# Patient Record
Sex: Female | Born: 1965 | Race: Black or African American | Hispanic: No | Marital: Married | Smoking: Never smoker
Health system: Southern US, Community
[De-identification: ages and names within clinical notes are randomized; demographics above are authoritative.]

## PROBLEM LIST (undated history)

## (undated) DIAGNOSIS — Z87898 Personal history of other specified conditions: Secondary | ICD-10-CM

## (undated) DIAGNOSIS — I10 Essential (primary) hypertension: Secondary | ICD-10-CM

## (undated) DIAGNOSIS — S299XXA Unspecified injury of thorax, initial encounter: Secondary | ICD-10-CM

## (undated) DIAGNOSIS — F40240 Claustrophobia: Secondary | ICD-10-CM

## (undated) DIAGNOSIS — N920 Excessive and frequent menstruation with regular cycle: Secondary | ICD-10-CM

## (undated) DIAGNOSIS — Z889 Allergy status to unspecified drugs, medicaments and biological substances status: Secondary | ICD-10-CM

## (undated) DIAGNOSIS — Z8619 Personal history of other infectious and parasitic diseases: Secondary | ICD-10-CM

## (undated) HISTORY — DX: Essential (primary) hypertension: I10

## (undated) HISTORY — DX: Personal history of other specified conditions: Z87.898

## (undated) HISTORY — DX: Personal history of other infectious and parasitic diseases: Z86.19

## (undated) HISTORY — DX: Allergy status to unspecified drugs, medicaments and biological substances status: Z88.9

## (undated) HISTORY — PX: OTHER SURGICAL HISTORY: SHX169

---

## 2003-07-06 ENCOUNTER — Other Ambulatory Visit: Admission: RE | Admit: 2003-07-06 | Discharge: 2003-07-06 | Payer: Self-pay | Admitting: Obstetrics and Gynecology

## 2003-12-15 ENCOUNTER — Ambulatory Visit (HOSPITAL_COMMUNITY): Admission: RE | Admit: 2003-12-15 | Discharge: 2003-12-15 | Payer: Self-pay | Admitting: Obstetrics and Gynecology

## 2004-04-24 ENCOUNTER — Inpatient Hospital Stay (HOSPITAL_COMMUNITY): Admission: AD | Admit: 2004-04-24 | Discharge: 2004-04-26 | Payer: Self-pay | Admitting: Obstetrics and Gynecology

## 2004-07-20 ENCOUNTER — Other Ambulatory Visit: Admission: RE | Admit: 2004-07-20 | Discharge: 2004-07-20 | Payer: Self-pay | Admitting: Obstetrics and Gynecology

## 2005-08-01 ENCOUNTER — Other Ambulatory Visit: Admission: RE | Admit: 2005-08-01 | Discharge: 2005-08-01 | Payer: Self-pay | Admitting: Obstetrics and Gynecology

## 2005-09-01 ENCOUNTER — Encounter (INDEPENDENT_AMBULATORY_CARE_PROVIDER_SITE_OTHER): Payer: Self-pay | Admitting: *Deleted

## 2005-09-01 ENCOUNTER — Ambulatory Visit (HOSPITAL_COMMUNITY): Admission: AD | Admit: 2005-09-01 | Discharge: 2005-09-01 | Payer: Self-pay | Admitting: Obstetrics and Gynecology

## 2005-09-01 ENCOUNTER — Encounter: Payer: Self-pay | Admitting: Emergency Medicine

## 2009-07-30 ENCOUNTER — Emergency Department (HOSPITAL_COMMUNITY): Admission: EM | Admit: 2009-07-30 | Discharge: 2009-07-31 | Payer: Self-pay | Admitting: Emergency Medicine

## 2011-01-31 LAB — URINALYSIS, ROUTINE W REFLEX MICROSCOPIC
Bilirubin Urine: NEGATIVE
Glucose, UA: NEGATIVE mg/dL
Hgb urine dipstick: NEGATIVE
Ketones, ur: NEGATIVE mg/dL
Nitrite: POSITIVE — AB
Protein, ur: NEGATIVE mg/dL
Specific Gravity, Urine: 1.023 (ref 1.005–1.030)
Urobilinogen, UA: 1 mg/dL (ref 0.0–1.0)
pH: 5.5 (ref 5.0–8.0)

## 2011-01-31 LAB — CBC
MCHC: 32.2 g/dL (ref 30.0–36.0)
RDW: 16.2 % — ABNORMAL HIGH (ref 11.5–15.5)

## 2011-01-31 LAB — URINE MICROSCOPIC-ADD ON

## 2011-01-31 LAB — DIFFERENTIAL
Basophils Absolute: 0.1 10*3/uL (ref 0.0–0.1)
Basophils Relative: 0 % (ref 0–1)
Eosinophils Absolute: 0.4 10*3/uL (ref 0.0–0.7)
Neutro Abs: 8.1 10*3/uL — ABNORMAL HIGH (ref 1.7–7.7)
Neutrophils Relative %: 58 % (ref 43–77)

## 2011-01-31 LAB — POCT PREGNANCY, URINE: Preg Test, Ur: NEGATIVE

## 2011-01-31 LAB — POCT I-STAT, CHEM 8
Calcium, Ion: 1.2 mmol/L (ref 1.12–1.32)
Chloride: 102 mEq/L (ref 96–112)
HCT: 38 % (ref 36.0–46.0)
Hemoglobin: 12.9 g/dL (ref 12.0–15.0)
Potassium: 4.2 mEq/L (ref 3.5–5.1)

## 2011-03-15 NOTE — Op Note (Signed)
NAMEARTESHA, Courtney Drake NO.:  [id number]   MEDICAL RECORD NO.:  [id number]          PATIENT TYPE:  AMB   LOCATION:  SDC                           FACILITY:  [facility]   PHYSICIAN:  Osborn Coho, M.D.   DATE OF BIRTH:  1966/06/10   DATE OF PROCEDURE:  09/01/2005  DATE OF DISCHARGE:                                 OPERATIVE REPORT   PREOPERATIVE DIAGNOSIS:  Right ectopic pregnancy.   POSTOPERATIVE DIAGNOSIS:  Right ectopic pregnancy.   PROCEDURE:  Laparoscopic right salpingectomy.   SURGEON:  Osborn Coho, M.D.   ANESTHESIA:  General.   FLUIDS:  2500 mL.   URINE OUTPUT:  400 mL.   ESTIMATED BLOOD LOSS:  Approximately 20 mL of blood including clot in the  abdomen.   COMPLICATIONS:  None.   FINDINGS:  Right ectopic with hemoperitoneum.  Two small, approximately 1 cm  left paratubal cysts.   PROCEDURE:  The patient is taken to the operating room after risks,  benefits, alternatives reviewed with the patient. The patient verbalized  understanding and consent signed and witnessed.  After risks, benefits,  alternatives reviewed. The patient verbalized understanding and consent  signed and witnessed. The patient was placed under general anesthesia and  prepped and draped in the normal sterile fashion in the dorsolithotomy  position. A Foley was placed in the bladder and a sponge stick placed for  uterine manipulation. Attention was turned to the abdomen where a 10 mm  incision was made at the umbilicus. Laparoscope was introduced and findings  as noted above. After Veress needle and trocar advanced to the intra-  abdominal cavity. The left lower quadrant was identified. A 5 mm incision  was made and trocar introduced under direct visualization. A 5 mm suprapubic  incision in the midline was made as well and the trocar advanced under  direct visualization. Half percent Marcaine was used at each incision site  prior to incision and a total of 20 mL was used. While  in Trendelenburg  right ectopic was identified and ligated twice with 0 Vicryl Endoloops. The  portion of the tube was cauterized with the Kleppingers and excised, placed  in an Endopouch and removed via the left lower quadrant port after extending  it to 10 mm. The hemoperitoneum was suction irrigated copiously. It was  completely evacuated with reverse Trendelenburg. The patient was replaced in  a flat position and hemostasis was assured at the pedicle. The bilateral  ovaries appeared within normal limits. The 10 mm trocar was  removed and fascia repaired with 0 Vicryl in a running fashion.  Pneumoperitoneum was relieved. All skin incisions were repaired with 3-0  Monocryl using a subcuticular stitch. Sponge, lap and needle count was  correct. The patient tolerated procedure well and is currently awaiting  transfer to the recovery room in good condition.      Osborn Coho, M.D.  Electronically Signed     AR/MEDQ  D:  09/01/2005  T:  09/01/2005  Job:  [id number]

## 2011-03-15 NOTE — H&P (Signed)
Courtney Drake, Courtney Drake   MEDICAL RECORD NO.:  [id number]                   PATIENT TYPE:  MAT   LOCATION:  MATC                                 FACILITY:  [facility]   PHYSICIAN:  Osborn Coho, M.D.                DATE OF BIRTH:  Apr 14, 1966   DATE OF ADMISSION:  04/24/2004  DATE OF DISCHARGE:                                HISTORY & PHYSICAL   HISTORY OF PRESENT ILLNESS:  Courtney Drake is a 45 year old, gravida 2, para 1-0-0-  1, at 39-3/7 weeks, who presents with increasing uterine contractions today.  Denies leaking or bleeding and reports positive fetal movement.  Pregnancy  has been remarkable for:  (1) Advanced maternal age with amnio decline.  (2)  First trimester bleeding.  (3) Slightly late to care.  (4)  History of  questionable SGA infant.  (5) Elevated 1 hour Glucola with normal 3 hour  GTT.   PRENATAL LABORATORY DATA:  Blood type is B positive, Rh antibody negative,  VDRL nonreactive, rubella titer positive, hepatitis B surface antigen  negative, Pap was normal.  GC and Chlamydia cultures were negative.  Hemoglobin upon entry into practice was 11.4; it was 10 at 27 weeks.  Sickle  cell test was negative.  EDC of April 28, 2004, was established by last  menstrual period and was in agreement with ultrasound at approximately 7  weeks.  Group B strep culture was negative at 36 weeks.  GC and Chlamydia  cultures were also negative.   HISTORY OF PRESENT PREGNANCY:  The patient entered care at approximately 18  weeks.  She was considering amnio but then elected to defer.  She had a  normal quadruple screen.  She had an elevated 1 hour Glucola of 197 but had  a normal 3 hour GTT.  The rest of her pregnancy was essentially  uncomplicated.   MEDICAL HISTORY:  1. She had chicken pox as an adult.  2. She reports the usual childhood illnesses.  3. Her only hospitalization was for childbirth.  4. She has had no previous surgery.   OBSTETRICAL  HISTORY:  In 1989, she had a vaginal birth of a female infant,  weight 5 pounds 15 ounces at [redacted] weeks gestation.  She was in labor 7 hours.  She had epidural anesthesia.  She had no complications.   FAMILY HISTORY:  Mother and maternal uncles all had an MI.  Her mother had  chronic hypertension.  Nieces have asthma.  Mother and maternal uncle has  insulin-dependent diabetes.  Maternal uncle had dialysis for renal disease.  Maternal uncle had a stroke.  Father had some unknown type of cancer.  Brothers and sisters were alcohol and nicotine users.  Father was also an  alcohol user.   GENETIC HISTORY:  Remarkable for the patient's age of 65.  The patient's  sister had twins, and the  father of the baby is a twin.  There were 6 sets  of twins in the family.   SOCIAL HISTORY:  The patient is married to the father of the baby.  He is  involved and supportive.  His name is Courtney Drake.  The patient has 2 years of  college.  She is employed as a Information systems manager.  Her partner has 2 years  of college.  He is employed as a Estate agent.  She has been followed  by the physician service at [facility].  She denies any alcohol,  drug, or tobacco use during this pregnancy.   MEDICATION ALLERGIES:  None.  The patient does have sensitivity to POWDERED  GLOVES when worn by the patient.   PHYSICAL EXAMINATION:  VITAL SIGNS:  Blood pressure is initially 163/97.  Other vital signs are stable.  HEENT:  Within normal limits.  LUNGS:  Bilateral breath sounds are clear.  HEART:  Regular rate and rhythm without murmur.  BREASTS:  Soft and nontender.  ABDOMEN:  Fundal height is approximately 38 cm.  Estimated fetal weight is 7-  8 pounds.  Uterine contractions are every 3-5 minutes, moderate quality.  Fetal monitor shows reassuring tracing with occasional mild variables.  CERVICAL EXAM:  5 cm, 100% vertex at a -1 to a -2 station with bulging bag  of water.  EXTREMITIES:  Deep tendon reflexes are 2+  without clonus.  There is a trace  edema noted.   IMPRESSION:  1. Intrauterine pregnancy at 39-3/7 weeks.  2. Active labor.   PLAN:  1. Admit to birthing suite for consult with Dr. Su Hilt as attending     physician.  2. Routine physician orders.  3. Pain medication p.r.n. per patient request.     Renaldo Reel. Emilee Hero, C.N.M.                   Osborn Coho, M.D.    VLL/MEDQ  D:  04/24/2004  T:  04/24/2004  Job:  [id number]

## 2012-11-20 ENCOUNTER — Telehealth: Payer: Self-pay | Admitting: Obstetrics and Gynecology

## 2012-11-20 ENCOUNTER — Ambulatory Visit: Payer: BC Managed Care – PPO | Admitting: Obstetrics and Gynecology

## 2012-11-20 ENCOUNTER — Encounter: Payer: Self-pay | Admitting: Obstetrics and Gynecology

## 2012-11-20 VITALS — BP 142/90 | Ht 63.0 in | Wt 176.0 lb

## 2012-11-20 DIAGNOSIS — N920 Excessive and frequent menstruation with regular cycle: Secondary | ICD-10-CM | POA: Insufficient documentation

## 2012-11-20 DIAGNOSIS — Z124 Encounter for screening for malignant neoplasm of cervix: Secondary | ICD-10-CM

## 2012-11-20 DIAGNOSIS — Z139 Encounter for screening, unspecified: Secondary | ICD-10-CM

## 2012-11-20 DIAGNOSIS — Z8619 Personal history of other infectious and parasitic diseases: Secondary | ICD-10-CM | POA: Insufficient documentation

## 2012-11-20 DIAGNOSIS — L680 Hirsutism: Secondary | ICD-10-CM

## 2012-11-20 LAB — CBC
HCT: 26.8 % — ABNORMAL LOW (ref 36.0–46.0)
MCH: 19.5 pg — ABNORMAL LOW (ref 26.0–34.0)
MCHC: 29.9 g/dL — ABNORMAL LOW (ref 30.0–36.0)
RDW: 20 % — ABNORMAL HIGH (ref 11.5–15.5)

## 2012-11-20 LAB — TSH: TSH: 0.843 u[IU]/mL (ref 0.350–4.500)

## 2012-11-20 MED ORDER — EFLORNITHINE HCL 13.9 % EX CREA: 1.0000 "application " | TOPICAL_CREAM | Freq: Two times a day (BID) | CUTANEOUS | Status: DC

## 2012-11-20 NOTE — Telephone Encounter (Deleted)
Message sent To AR and JO and PG  Regarding placing order.  [facility] CMA

## 2012-11-20 NOTE — Progress Notes (Signed)
Contraception None Last pap per pt 2007 Normal  Last Mammo: never had one  Last Colonoscopy n/a Last Dexa Scan n/a Primary MD: ? Abuse at Home: No   C/o heavy prolonged cycles.  Filed Vitals:   11/20/12 0959  BP: 142/90     ROS: noncontributory  Physical Examination: General appearance - alert, well appearing, and in no distress Neck - supple, no significant adenopathy Chest - clear to auscultation, no wheezes, rales or rhonchi, symmetric air entry Heart - normal rate and regular rhythm Abdomen - soft, nontender, nondistended, no masses or organomegaly Breasts - breasts appear normal, no suspicious masses, no skin or nipple changes or axillary nodes Pelvic - normal external genitalia, vulva, vagina, cervix, uterus and adnexa Back exam - no CVAT Extremities - no edema, redness or tenderness in the calves or thighs  A/P Menorrhagia - labs, u/s, embx at [facility] rto after u/s for f/u and embx sched mammo Hirsutism - labs and trial of vaniqa

## 2012-11-20 NOTE — Telephone Encounter (Signed)
Order in system for screening mammogram  Darien Ramus, CMA

## 2012-11-23 LAB — PAP IG W/ RFLX HPV ASCU

## 2012-11-23 LAB — TESTOSTERONE, TOTAL AND FREE DIRECT MEASURE: Testosterone: 36.14 ng/dL (ref 10–70)

## 2012-11-24 ENCOUNTER — Encounter: Payer: Self-pay | Admitting: Obstetrics and Gynecology

## 2012-11-24 ENCOUNTER — Ambulatory Visit: Payer: Self-pay | Admitting: Obstetrics and Gynecology

## 2012-12-01 ENCOUNTER — Telehealth: Payer: Self-pay

## 2012-12-01 NOTE — Telephone Encounter (Signed)
LM for pt to cb  Re: blood test results.Melody Comas A

## 2012-12-02 ENCOUNTER — Telehealth: Payer: Self-pay

## 2012-12-02 NOTE — Telephone Encounter (Signed)
LM for pt to cb re: labs. Melody Comas A

## 2012-12-04 ENCOUNTER — Ambulatory Visit
Admission: RE | Admit: 2012-12-04 | Discharge: 2012-12-04 | Disposition: A | Discharge status: Home / Self Care | Payer: BC Managed Care – PPO | Source: Ambulatory Visit | Attending: Obstetrics and Gynecology | Admitting: Obstetrics and Gynecology

## 2012-12-04 ENCOUNTER — Ambulatory Visit: Payer: Self-pay

## 2012-12-04 DIAGNOSIS — Z139 Encounter for screening, unspecified: Secondary | ICD-10-CM

## 2012-12-07 ENCOUNTER — Telehealth: Payer: Self-pay

## 2012-12-07 DIAGNOSIS — N911 Secondary amenorrhea: Secondary | ICD-10-CM | POA: Insufficient documentation

## 2012-12-07 NOTE — Telephone Encounter (Signed)
LM for pt. To start taking iron supplement bid. I also rec stool softener for inevitable resulting constipation and for her to increase water intake a lot. Courtney Drake A

## 2012-12-09 ENCOUNTER — Other Ambulatory Visit: Payer: Self-pay | Admitting: Obstetrics and Gynecology

## 2012-12-09 ENCOUNTER — Ambulatory Visit: Payer: BC Managed Care – PPO | Admitting: Obstetrics and Gynecology

## 2012-12-09 ENCOUNTER — Telehealth: Payer: Self-pay | Admitting: Obstetrics and Gynecology

## 2012-12-09 ENCOUNTER — Encounter: Payer: Self-pay | Admitting: Obstetrics and Gynecology

## 2012-12-09 ENCOUNTER — Ambulatory Visit: Payer: BC Managed Care – PPO

## 2012-12-09 VITALS — BP 138/82 | Resp 16 | Ht 63.0 in | Wt 176.0 lb

## 2012-12-09 DIAGNOSIS — N920 Excessive and frequent menstruation with regular cycle: Secondary | ICD-10-CM

## 2012-12-09 NOTE — Progress Notes (Addendum)
Here to f/u u/s, labs and get em bx  Filed Vitals:   12/09/12 1105  BP: 138/82  Resp: 16   ROS: noncontributory  Pelvic exam:  VULVA: normal appearing vulva with no masses, tenderness or lesions,  VAGINA: normal appearing vagina with normal color and discharge, no lesions, CERVIX: normal appearing cervix without discharge or lesions,  UTERUS: uterus is normal size, shape, consistency and nontender,  ADNEXA: normal adnexa in size, nontender and no masses.  U/s - ut 6.5x5.4x4.9cm, nl bil ovaries, lt 3.1cm, rt 3.1cm  EmBx Performed per protocol Pipella passed x 3 to 10cm  Labs wnl except hgb 8 Pap neg  A/P Options and recs reviewed - rec tx secondary to anemia rec iron tid with colace Pt considering hysterectomy but all options discussed and rec minor procedures as first line (mirena vs ablation) Pamphlets - ablation, mirena, hysterectomy RTO in 1-2 wks to f/u bx results and discuss decision If pt wants ablation she will need sonohyst in prep for in office hyst/D&C/Ablation

## 2012-12-09 NOTE — Telephone Encounter (Signed)
Advised pt per U/S tech to come on full bladder  Darien Ramus, CMA

## 2012-12-14 LAB — PATHOLOGY

## 2013-03-09 ENCOUNTER — Emergency Department (HOSPITAL_COMMUNITY): Payer: BC Managed Care – PPO

## 2013-03-09 ENCOUNTER — Emergency Department (HOSPITAL_COMMUNITY)
Admission: EM | Admit: 2013-03-09 | Discharge: 2013-03-09 | Disposition: A | Discharge status: Home / Self Care | Payer: BC Managed Care – PPO | Attending: Emergency Medicine | Admitting: Emergency Medicine

## 2013-03-09 ENCOUNTER — Encounter (HOSPITAL_COMMUNITY): Payer: Self-pay | Admitting: Emergency Medicine

## 2013-03-09 ENCOUNTER — Other Ambulatory Visit: Payer: Self-pay

## 2013-03-09 DIAGNOSIS — M79609 Pain in unspecified limb: Secondary | ICD-10-CM | POA: Insufficient documentation

## 2013-03-09 DIAGNOSIS — I10 Essential (primary) hypertension: Secondary | ICD-10-CM | POA: Insufficient documentation

## 2013-03-09 DIAGNOSIS — R079 Chest pain, unspecified: Secondary | ICD-10-CM | POA: Insufficient documentation

## 2013-03-09 DIAGNOSIS — Z8742 Personal history of other diseases of the female genital tract: Secondary | ICD-10-CM | POA: Insufficient documentation

## 2013-03-09 DIAGNOSIS — M722 Plantar fascial fibromatosis: Secondary | ICD-10-CM

## 2013-03-09 DIAGNOSIS — Z8619 Personal history of other infectious and parasitic diseases: Secondary | ICD-10-CM | POA: Insufficient documentation

## 2013-03-09 LAB — BASIC METABOLIC PANEL
BUN: 7 mg/dL (ref 6–23)
CO2: 27 mEq/L (ref 19–32)
Chloride: 101 mEq/L (ref 96–112)
Creatinine, Ser: 0.63 mg/dL (ref 0.50–1.10)
GFR calc Af Amer: >90 mL/min (ref >90)
Glucose, Bld: 91 mg/dL (ref 70–99)
Potassium: 3.8 mEq/L (ref 3.5–5.1)

## 2013-03-09 LAB — POCT I-STAT TROPONIN I: Troponin i, poc: 0 ng/mL (ref 0.00–0.08)

## 2013-03-09 LAB — CBC
HCT: 35.8 % — ABNORMAL LOW (ref 36.0–46.0)
Hemoglobin: 11.7 g/dL — ABNORMAL LOW (ref 12.0–15.0)
MCV: 83.1 fL (ref 78.0–100.0)
RDW: 18.7 % — ABNORMAL HIGH (ref 11.5–15.5)
WBC: 9.8 10*3/uL (ref 4.0–10.5)

## 2013-03-09 MED ORDER — LORAZEPAM 1 MG PO TABS: 1.0000 mg | ORAL_TABLET | Freq: Once | ORAL | Status: DC

## 2013-03-09 MED ORDER — IOHEXOL 300 MG/ML  SOLN
80.0000 mL | Freq: Once | INTRAMUSCULAR | Status: AC | PRN
  Administered 2013-03-09: 80 mL via INTRAVENOUS

## 2013-03-09 MED ORDER — OMEPRAZOLE 20 MG PO CPDR: 20.0000 mg | DELAYED_RELEASE_CAPSULE | Freq: Every day | ORAL | Status: DC

## 2013-03-09 MED ORDER — LORAZEPAM 2 MG/ML IJ SOLN
1.0000 mg | Freq: Once | INTRAMUSCULAR | Status: DC
  Filled 2013-03-09: qty 1

## 2013-03-09 MED ORDER — HYDROCHLOROTHIAZIDE 12.5 MG PO TABS: 12.5000 mg | ORAL_TABLET | Freq: Every day | ORAL | Status: DC

## 2013-03-09 NOTE — ED Provider Notes (Signed)
History     CSN: [id number]  Arrival date & time 03/09/13  4098   First MD Initiated Contact with Patient 03/09/13 [phone]      Chief Complaint  Patient presents with  . Chest Pain  . Leg Pain    (Consider location/radiation/quality/duration/timing/severity/associated sxs/prior treatment) HPI Comments: Patient presents emergency department with chief complaint of chest pain times one month. She states that she notices the pain when she lays down at night. She states the pain is located on the left side of her chest and sometimes radiates to her left arm. She denies any diaphoresis or clamminess, but does endorse some intermittent shortness of breath. She denies fevers, chills, nausea, or vomiting. She states that the pain is not exacerbated with activity. She has not tried taking anything to alleviate her symptoms.  Additionally, she complains of left foot pain. She states this is been going on for several months. She states the pain is located on the bottom of her foot near the arch. She believes that it is worsened with the shoes that she wears. She has not tried anything to alleviate her symptoms. She states the pain radiates to her ankle.  The history is provided by the patient. No language interpreter was used.    Past Medical History  Diagnosis Date  . Ectopic pregnancy affecting fetus or newborn   . History of chicken pox   . Hypertension   . Secondary amenorrhea   . Galactorrhea     Past Surgical History  Procedure Laterality Date  . Fallopian tube removal      Family History  Problem Relation Age of Onset  . Breast cancer Father   . Heart attack Mother   . Diabetes Mother   . Hypertension Mother   . Stroke Mother   . Kidney failure Mother   . Asthma Daughter   . Kidney failure Maternal Uncle     History  Substance Use Topics  . Smoking status: Never Smoker   . Smokeless tobacco: Not on file  . Alcohol Use: No    OB History   Grav Para Term Preterm  Abortions TAB SAB Ect Mult Living   3 2   1  1          Review of Systems  All other systems reviewed and are negative.    Allergies  Review of patient's allergies indicates no known allergies.  Home Medications  No current outpatient prescriptions on file.  BP 197/113  Pulse 87  Temp(Src) 98 F (36.7 C) (Oral)  Resp 18  SpO2 99%  LMP 02/19/2013  Physical Exam  Nursing note and vitals reviewed. Constitutional: She is oriented to person, place, and time. She appears well-developed and well-nourished.  HENT:  Head: Normocephalic and atraumatic.  Eyes: Conjunctivae and EOM are normal. Pupils are equal, round, and reactive to light.  Neck: Normal range of motion. Neck supple.  Cardiovascular: Normal rate and regular rhythm.  Exam reveals no gallop and no friction rub.   No murmur heard. Pulmonary/Chest: Effort normal and breath sounds normal. No respiratory distress. She has no wheezes. She has no rales. She exhibits no tenderness.  Abdominal: Soft. Bowel sounds are normal. She exhibits no distension and no mass. There is no tenderness. There is no rebound and no guarding.  Musculoskeletal: Normal range of motion. She exhibits no edema and no tenderness.  Left foot mildly tender to palpation over the arch, range of motion is 5/5, strength 5/5, no palpable abnormality or deformity  Neurological: She is alert and oriented to person, place, and time.  Skin: Skin is warm and dry.  Psychiatric: She has a normal mood and affect. Her behavior is normal. Judgment and thought content normal.    ED Course  Procedures (including critical care time)  Labs Reviewed  CBC  BASIC METABOLIC PANEL  POCT I-STAT TROPONIN I   Dg Chest 2 View  03/09/2013  *RADIOLOGY REPORT*  Clinical Data: Chest pain  CHEST - 2 VIEW  Comparison: None.  Findings: Borderline cardiomegaly.  No pulmonary edema.  There is nodular density in the upper lobe posteriorly seen on lateral view. Further evaluation with  CT scan of the chest is recommended to exclude a lung nodule.  No segmental infiltrate.  Bony thorax is unremarkable.  IMPRESSION: There is nodular density in the upper lobe posteriorly seen on lateral view.  Further evaluation with CT scan of the chest is recommended to exclude a lung nodule.  No segmental infiltrate.   Original Report Authenticated By: Natasha Mead, M.D.    Results for orders placed during the hospital encounter of 03/09/13  CBC      Result Value Range   WBC 9.8  4.0 - 10.5 K/uL   RBC 4.31  3.87 - 5.11 MIL/uL   Hemoglobin 11.7 (*) 12.0 - 15.0 g/dL   HCT 16.1 (*) 09.6 - 04.5 %   MCV 83.1  78.0 - 100.0 fL   MCH 27.1  26.0 - 34.0 pg   MCHC 32.7  30.0 - 36.0 g/dL   RDW 40.9 (*) 81.1 - 91.4 %   Platelets 273  150 - 400 K/uL  BASIC METABOLIC PANEL      Result Value Range   Sodium 135  135 - 145 mEq/L   Potassium 3.8  3.5 - 5.1 mEq/L   Chloride 101  96 - 112 mEq/L   CO2 27  19 - 32 mEq/L   Glucose, Bld 91  70 - 99 mg/dL   BUN 7  6 - 23 mg/dL   Creatinine, Ser 7.82  0.50 - 1.10 mg/dL   Calcium 8.9  8.4 - 95.6 mg/dL   GFR calc non Af Amer >90  >90 mL/min   GFR calc Af Amer >90  >90 mL/min  POCT I-STAT TROPONIN I      Result Value Range   Troponin i, poc 0.00  0.00 - 0.08 ng/mL   Comment 3           POCT I-STAT TROPONIN I      Result Value Range   Troponin i, poc 0.00  0.00 - 0.08 ng/mL   Comment 3            Dg Chest 2 View  03/09/2013  *RADIOLOGY REPORT*  Clinical Data: Chest pain  CHEST - 2 VIEW  Comparison: None.  Findings: Borderline cardiomegaly.  No pulmonary edema.  There is nodular density in the upper lobe posteriorly seen on lateral view. Further evaluation with CT scan of the chest is recommended to exclude a lung nodule.  No segmental infiltrate.  Bony thorax is unremarkable.  IMPRESSION: There is nodular density in the upper lobe posteriorly seen on lateral view.  Further evaluation with CT scan of the chest is recommended to exclude a lung nodule.  No  segmental infiltrate.   Original Report Authenticated By: Natasha Mead, M.D.    Ct Chest W Contrast  03/09/2013  *RADIOLOGY REPORT*  Clinical Data: Evaluate nodule.  CT CHEST WITH CONTRAST  Technique:  Multidetector CT imaging of the chest was performed following the standard protocol during bolus administration of intravenous contrast.  Contrast: 80mL OMNIPAQUE IOHEXOL 300 MG/ML  SOLN  Comparison: Chest radiograph 05/13 /2014  Findings: The heart is mildly enlarged.  The pericardium is normal. The thoracic aorta is normal in caliber and enhancement.  There is a small hiatal hernia.  Imaged portion of the thyroid gland is normal.  No pathologic lymphadenopathy in the chest.  Negative for pleural effusion.  There is a very small amount of soft tissue density admixed with fat in the anterior mediastinum consistent with a small amount of residual thymic tissue.  Both lungs are well expanded and clear.  Negative for pulmonary mass, nodule, or airspace disease.  The trachea and mainstem bronchi are patent.  The thoracic spine vertebral bodies are normal in height and alignment.  There are no significant degenerative changes.  No acute or suspicious bony abnormality in the thorax.  Approximately five tiny circumscribed low density lesions in the liver, the largest measuring 9 mm.  These are most consistent with benign hepatic cysts.  In the proximal pancreatic body is a 16 x 11 mm cyst.  In the pancreatic tail is a 7 x 6 mm low density lesion.  The spleen, adrenal glands, and visualized upper poles of the kidneys are normal.  IMPRESSION: 1.  Negative for pulmonary nodule or mass.  The opacity seen on the lateral view of today's chest radiograph likely reflected overlap of pulmonary vessels and scapula. 2.  No acute findings in the chest. 3. 16 x 11 mm cyst in the pancreatic body and 7 x 6 mm low density lesion in the pancreatic tail. These are likely benign.  A follow- up MRI of the abdomen with and without contrast  should be considered and 1 year for follow-up.  Alternatively, a CT of the abdomen with contrast could be performed in 1 year. 4.  Multiple small hepatic cysts. 5.  Mild cardiomegaly.  6.  Small hiatal hernia.   Original Report Authenticated By: Britta Mccreedy, M.D.       1. Chest pain, unspecified   2. Plantar fasciitis       MDM  Patient with chest pain times one month. We'll check basic labs and chest x-ray. Will reevaluate. She is also having left foot pain, which I believe is plantar fasciitis based on her presentation and exam.  I will order a CT chest to evaluate for the lung nodule today.    1:44 PM Still do not have creatinine result from BMP 2/2 downtime.  CT chest after creatinine.  Patient discussed with Dr. Effie Shy, who agrees with the plan.  No acute findings on chest CT.  Pain has been ongoing for 1 month.  Doubt ACS, TIMI score is 0.  PERC is negative.  Will discharge to home.  Will start the patient on HCTZ, as she is concerned about her BP, but I told her that she will need to seek follow-up care as this not something that we routinely manage in the ED.  I also have some suspicion that this could be GERD related, so I am going to try her on some omeprazole.  Patient given the resource guide.  She will follow up with primary care.  Incidental findings include: 16 x 11 mm cyst in the pancreatic body and 7 x 6 mm low density lesion in the pancreatic tail. These are likely benign.  A follow- up MRI of the abdomen with and  without contrast should be considered and 1 year for follow-up.  Alternatively, a CT of the abdomen with contrast could be performed in 1 year.  I had a conversation regarding the above mentioned incidental findings with the patient, and she verbally acknowledged that she would follow-up with her doctor in a year for MRI or repeat CT.  Roxy Horseman, PA-C 03/09/13 1830  Roxy Horseman, PA-C 03/12/13 [phone]

## 2013-03-09 NOTE — ED Notes (Signed)
Lab called to check on lab results.  Results continue to be pending.

## 2013-03-09 NOTE — ED Notes (Signed)
Pt c/o generalized CP x 1 month; pt sts left heel pain x several months

## 2013-03-12 NOTE — ED Provider Notes (Signed)
Medical screening examination/treatment/procedure(s) were performed by non-physician practitioner and as supervising physician I was immediately available for consultation/collaboration.  Flint Melter, MD 03/12/13 2120

## 2014-01-12 ENCOUNTER — Emergency Department (HOSPITAL_COMMUNITY): Payer: BC Managed Care – PPO

## 2014-01-12 ENCOUNTER — Inpatient Hospital Stay (HOSPITAL_COMMUNITY)
Admission: EM | Admit: 2014-01-12 | Discharge: 2014-01-13 | DRG: 305 | Disposition: A | Discharge status: Home / Self Care | Payer: BC Managed Care – PPO | Attending: Internal Medicine | Admitting: Internal Medicine

## 2014-01-12 ENCOUNTER — Encounter (HOSPITAL_COMMUNITY): Payer: Self-pay | Admitting: Emergency Medicine

## 2014-01-12 DIAGNOSIS — Z803 Family history of malignant neoplasm of breast: Secondary | ICD-10-CM

## 2014-01-12 DIAGNOSIS — R079 Chest pain, unspecified: Secondary | ICD-10-CM | POA: Diagnosis present

## 2014-01-12 DIAGNOSIS — I10 Essential (primary) hypertension: Principal | ICD-10-CM | POA: Diagnosis present

## 2014-01-12 DIAGNOSIS — E876 Hypokalemia: Secondary | ICD-10-CM | POA: Diagnosis present

## 2014-01-12 DIAGNOSIS — Z6831 Body mass index (BMI) 31.0-31.9, adult: Secondary | ICD-10-CM

## 2014-01-12 DIAGNOSIS — Z825 Family history of asthma and other chronic lower respiratory diseases: Secondary | ICD-10-CM

## 2014-01-12 DIAGNOSIS — Z8249 Family history of ischemic heart disease and other diseases of the circulatory system: Secondary | ICD-10-CM

## 2014-01-12 DIAGNOSIS — Z833 Family history of diabetes mellitus: Secondary | ICD-10-CM

## 2014-01-12 DIAGNOSIS — R072 Precordial pain: Secondary | ICD-10-CM

## 2014-01-12 DIAGNOSIS — I200 Unstable angina: Secondary | ICD-10-CM | POA: Diagnosis present

## 2014-01-12 DIAGNOSIS — D649 Anemia, unspecified: Secondary | ICD-10-CM | POA: Diagnosis present

## 2014-01-12 DIAGNOSIS — E669 Obesity, unspecified: Secondary | ICD-10-CM | POA: Diagnosis present

## 2014-01-12 DIAGNOSIS — Z823 Family history of stroke: Secondary | ICD-10-CM

## 2014-01-12 HISTORY — DX: Claustrophobia: F40.240

## 2014-01-12 HISTORY — DX: Excessive and frequent menstruation with regular cycle: N92.0

## 2014-01-12 LAB — BASIC METABOLIC PANEL
BUN: 8 mg/dL (ref 6–23)
CHLORIDE: 100 meq/L (ref 96–112)
CO2: 24 meq/L (ref 19–32)
Calcium: 9 mg/dL (ref 8.4–10.5)
Creatinine, Ser: 0.6 mg/dL (ref 0.50–1.10)
GFR calc Af Amer: >90 mL/min (ref >90)
GFR calc non Af Amer: >90 mL/min (ref >90)
GLUCOSE: 112 mg/dL — AB (ref 70–99)
POTASSIUM: 3.5 meq/L — AB (ref 3.7–5.3)
Sodium: 137 mEq/L (ref 137–147)

## 2014-01-12 LAB — I-STAT TROPONIN, ED: Troponin i, poc: 0.01 ng/mL (ref 0.00–0.08)

## 2014-01-12 LAB — CBC
HCT: 34 % — ABNORMAL LOW (ref 36.0–46.0)
HEMOGLOBIN: 10.8 g/dL — AB (ref 12.0–15.0)
MCH: 25.5 pg — AB (ref 26.0–34.0)
MCHC: 31.8 g/dL (ref 30.0–36.0)
MCV: 80.2 fL (ref 78.0–100.0)
PLATELETS: 324 10*3/uL (ref 150–400)
RBC: 4.24 MIL/uL (ref 3.87–5.11)
RDW: 15.9 % — ABNORMAL HIGH (ref 11.5–15.5)
WBC: 10.6 10*3/uL — AB (ref 4.0–10.5)

## 2014-01-12 LAB — MAGNESIUM: Magnesium: 1.9 mg/dL (ref 1.5–2.5)

## 2014-01-12 LAB — PROTIME-INR
INR: 1.03 (ref 0.00–1.49)
PROTHROMBIN TIME: 13.3 s (ref 11.6–15.2)

## 2014-01-12 LAB — HEMOGLOBIN A1C
Hgb A1c MFr Bld: 6 % — ABNORMAL HIGH (ref <5.7)
Mean Plasma Glucose: 126 mg/dL — ABNORMAL HIGH (ref <117)

## 2014-01-12 LAB — TROPONIN I: Troponin I: <0.3 ng/mL (ref <0.30)

## 2014-01-12 LAB — APTT: aPTT: 96 seconds — ABNORMAL HIGH (ref 24–37)

## 2014-01-12 LAB — PRO B NATRIURETIC PEPTIDE: PRO B NATRI PEPTIDE: 116.8 pg/mL (ref 0–125)

## 2014-01-12 MED ORDER — METOPROLOL TARTRATE 1 MG/ML IV SOLN: 5.0000 mg | INTRAVENOUS | Status: DC | PRN

## 2014-01-12 MED ORDER — METOPROLOL TARTRATE 1 MG/ML IV SOLN
5.0000 mg | Freq: Four times a day (QID) | INTRAVENOUS | Status: DC
  Administered 2014-01-12: 5 mg via INTRAVENOUS
  Filled 2014-01-12: qty 5

## 2014-01-12 MED ORDER — NITROGLYCERIN 0.4 MG SL SUBL: 0.4000 mg | SUBLINGUAL_TABLET | SUBLINGUAL | Status: DC | PRN

## 2014-01-12 MED ORDER — ATORVASTATIN CALCIUM 80 MG PO TABS
80.0000 mg | ORAL_TABLET | Freq: Every day | ORAL | Status: DC
  Administered 2014-01-12: 80 mg via ORAL
  Filled 2014-01-12 (×2): qty 1

## 2014-01-12 MED ORDER — NITROGLYCERIN IN D5W 200-5 MCG/ML-% IV SOLN: 2.0000 ug/min | INTRAVENOUS | Status: DC

## 2014-01-12 MED ORDER — CARVEDILOL 3.125 MG PO TABS
3.1250 mg | ORAL_TABLET | Freq: Two times a day (BID) | ORAL | Status: DC
  Administered 2014-01-12 – 2014-01-13 (×2): 3.125 mg via ORAL
  Filled 2014-01-12 (×4): qty 1

## 2014-01-12 MED ORDER — LISINOPRIL 5 MG PO TABS
5.0000 mg | ORAL_TABLET | Freq: Every day | ORAL | Status: DC
  Administered 2014-01-12 – 2014-01-13 (×2): 5 mg via ORAL
  Filled 2014-01-12 (×2): qty 1

## 2014-01-12 MED ORDER — HEPARIN BOLUS VIA INFUSION
4000.0000 [IU] | Freq: Once | INTRAVENOUS | Status: AC
  Administered 2014-01-12: 4000 [IU] via INTRAVENOUS
  Filled 2014-01-12: qty 4000

## 2014-01-12 MED ORDER — HYDRALAZINE HCL 20 MG/ML IJ SOLN
10.0000 mg | INTRAMUSCULAR | Status: DC | PRN
  Administered 2014-01-12: 10 mg via INTRAVENOUS
  Filled 2014-01-12: qty 1

## 2014-01-12 MED ORDER — HEPARIN (PORCINE) IN NACL 100-0.45 UNIT/ML-% IJ SOLN
900.0000 [IU]/h | INTRAMUSCULAR | Status: DC
  Administered 2014-01-12: 900 [IU]/h via INTRAVENOUS
  Filled 2014-01-12: qty 250

## 2014-01-12 MED ORDER — ASPIRIN 81 MG PO CHEW
324.0000 mg | CHEWABLE_TABLET | Freq: Once | ORAL | Status: AC
  Administered 2014-01-12: 324 mg via ORAL
  Filled 2014-01-12: qty 4

## 2014-01-12 MED ORDER — HYDRALAZINE HCL 20 MG/ML IJ SOLN: 5.0000 mg | INTRAMUSCULAR | Status: DC | PRN

## 2014-01-12 MED ORDER — ASPIRIN EC 81 MG PO TBEC
81.0000 mg | DELAYED_RELEASE_TABLET | Freq: Every day | ORAL | Status: DC
  Administered 2014-01-13: 81 mg via ORAL
  Filled 2014-01-12: qty 1

## 2014-01-12 MED ORDER — ASPIRIN EC 325 MG PO TBEC: 325.0000 mg | DELAYED_RELEASE_TABLET | Freq: Once | ORAL | Status: DC

## 2014-01-12 MED ORDER — POTASSIUM CHLORIDE CRYS ER 20 MEQ PO TBCR
40.0000 meq | EXTENDED_RELEASE_TABLET | Freq: Once | ORAL | Status: AC
  Administered 2014-01-12: 40 meq via ORAL
  Filled 2014-01-12: qty 2

## 2014-01-12 MED ORDER — ONDANSETRON HCL 4 MG/2ML IJ SOLN
4.0000 mg | Freq: Three times a day (TID) | INTRAMUSCULAR | Status: DC | PRN
  Administered 2014-01-12: 4 mg via INTRAVENOUS
  Filled 2014-01-12: qty 2

## 2014-01-12 NOTE — Care Management Note (Signed)
    Page 1 of 1   01/12/2014     2:47:31 PM   CARE MANAGEMENT NOTE 01/12/2014  Patient:  Dorothy PufferLI,Lakeishia M   Account Number:  [id number]  Date Initiated:  01/12/2014  Documentation initiated by:  GRAVES-BIGELOW,Kimberlyann Hollar  Subjective/Objective Assessment:   Pt admitted for CP / Htn urgency.     Action/Plan:   CM received consult for PCP. CM did provide pt with [facility] # for pt to call. No further needs from CM at this time.   Anticipated DC Date:  01/14/2014   Anticipated DC Plan:  HOME/SELF CARE      DC Planning Services  CM consult      Choice offered to / List presented to:             Status of service:  Completed, signed off Medicare Important Message given?   (If response is "NO", the following Medicare IM given date fields will be blank) Date Medicare IM given:   Date Additional Medicare IM given:    Discharge Disposition:  HOME/SELF CARE  Per UR Regulation:  Reviewed for med. necessity/level of care/duration of stay  If discussed at Long Length of Stay Meetings, dates discussed:    Comments:

## 2014-01-12 NOTE — Consult Note (Addendum)
Patient is young and sedentary. Her neck,left arm, and chest discomfort is nearly continuous and has features more compatible with musculoskeletal or neurogenic etiology than cardiac. Her dyspnea on exertion is concerning and probably multifactorial, but certainly there could be a component of DHF aggravated by poor BP control. Plan nuclear myocardial perfusion study to r/o CAD. Echo to assess LV and wall thickness. Need to treat Htn and other risk factors. Screen for DM.

## 2014-01-12 NOTE — Progress Notes (Signed)
UR Completed Lenward Able Graves-Bigelow, RN,BSN [phone]  

## 2014-01-12 NOTE — Progress Notes (Signed)
Pts BP recheck d/t being 201/110 earlier. Pt states she vomited in bathroom after trying to eat some dinner. Denies current nausea and states feels better. BP currently down to 122/68 manually. No neuro changes. Dr on call made aware. Will cont to monitor.

## 2014-01-12 NOTE — ED Provider Notes (Signed)
CSN: [id number]     Arrival date & time 01/12/14  [id number] History   First MD Initiated Contact with Patient 01/12/14 0830     Chief Complaint  Patient presents with  . Chest Pain     (Consider location/radiation/quality/duration/timing/severity/associated sxs/prior Treatment) Patient is a 48 y.o. female presenting with chest pain. The history is provided by the patient. No language interpreter was used.  Chest Pain Associated symptoms: shortness of breath   Associated symptoms: no abdominal pain, no cough, no fever, no headache, no nausea, no palpitations and not vomiting   This is a 47yo AAF w/ PMH previously diagnosed HTN (currently not on treatment) who presents w/ c/o central chest "heaviness" radiating into L arm that began at 0430 this AM. Pain woke pt up from sleep and was constant for three hours. Denies SOB, nausea, diaphoresis, palpitations. CP has eased up since onset, but she continues to have L shoulder pain. Patient has hx of intermittent similar chest pain that occurs w/ exertion and is associated with SOB. Both the CP and SOB usually resolve with rest. No recent injury or new activity. Denies cough, ST, BLE edema, abd pain. She does not have a PCP. She notes she was previously treated for HTN via ED w/ HCTZ, but is no longer taking this medication. BP during my interview was 200/101.   Past Medical History  Diagnosis Date  . Ectopic pregnancy affecting fetus or newborn   . History of chicken pox   . Hypertension   . Secondary amenorrhea   . Galactorrhea    Past Surgical History  Procedure Laterality Date  . Fallopian tube removal     Family History  Problem Relation Age of Onset  . Breast cancer Father   . Heart attack Mother   . Diabetes Mother   . Hypertension Mother   . Stroke Mother   . Kidney failure Mother   . Asthma Daughter   . Kidney failure Maternal Uncle    History  Substance Use Topics  . Smoking status: Never Smoker   . Smokeless tobacco: Not on  file  . Alcohol Use: No   OB History   Grav Para Term Preterm Abortions TAB SAB Ect Mult Living   3 2   1  1         Review of Systems  Constitutional: Negative for fever and chills.  HENT: Negative for congestion and sore throat.   Eyes: Negative for visual disturbance.  Respiratory: Positive for shortness of breath. Negative for cough.   Cardiovascular: Positive for chest pain. Negative for palpitations and leg swelling.  Gastrointestinal: Negative for nausea, vomiting, abdominal pain and diarrhea.  Genitourinary: Negative for dysuria.  Neurological: Negative for headaches.  All other systems reviewed and are negative.      Allergies  Review of patient's allergies indicates no known allergies.  Home Medications  No current outpatient prescriptions on file. BP 177/117  Temp(Src) 98.1 F (36.7 C) (Oral)  Resp 18  Ht 5\' 3"  (1.6 m)  Wt 198 lb (89.812 kg)  BMI 35.08 kg/m2  SpO2 98%  LMP 01/05/2014 Physical Exam  Nursing note and vitals reviewed. Constitutional: She is oriented to person, place, and time. She appears well-developed and well-nourished. No distress.  HENT:  Head: Normocephalic and atraumatic.  Mouth/Throat: Oropharynx is clear and moist.  Eyes: Conjunctivae and EOM are normal. Pupils are equal, round, and reactive to light.  Neck: Normal range of motion. Neck supple.  Cardiovascular: Normal rate, regular rhythm and  normal heart sounds.  Exam reveals no gallop and no friction rub.   No murmur heard. Pulmonary/Chest: Effort normal and breath sounds normal. No respiratory distress. She has no wheezes. She has no rales.  Abdominal: Soft. Bowel sounds are normal. There is no tenderness.  Musculoskeletal: She exhibits no edema.  Neurological: She is alert and oriented to person, place, and time. No cranial nerve deficit.  Skin: Skin is warm and dry. She is not diaphoretic.    ED Course  Procedures (including critical care time) Labs Review Labs Reviewed   CBC - Abnormal; Notable for the following:    WBC 10.6 (*)    Hemoglobin 10.8 (*)    HCT 34.0 (*)    MCH 25.5 (*)    RDW 15.9 (*)    All other components within normal limits  BASIC METABOLIC PANEL  PRO B NATRIURETIC PEPTIDE  I-STAT TROPOININ, ED   Imaging Review Dg Chest 2 View  01/12/2014   CLINICAL DATA:  Chest pain  EXAM: CHEST  2 VIEW  COMPARISON:  CT CHEST W/CM dated 03/09/2013; DG CHEST 2 VIEW dated 03/09/2013  FINDINGS: The lungs are well-expanded. There is no focal infiltrate. There is no pleural effusion. The cardiac silhouette is normal in size. The pulmonary vascularity is not engorged. The mediastinum is normal in width. There is no pleural effusion. The observed portions of the bony thorax appear normal.  IMPRESSION: There is no active cardiopulmonary disease.   Electronically Signed   By: David  [place]   On: 01/12/2014 08:58     EKG Interpretation   Date/Time:  Wednesday January 12 2014 08:23:27 EDT Ventricular Rate:  92 PR Interval:  158 QRS Duration: 84 QT Interval:  346 QTC Calculation: 427 R Axis:   65 Text Interpretation:  Normal sinus rhythm Nonspecific T wave abnormality  Abnormal ECG Since last tracing Nonspecific T wave abnormality NOW PRESENT  Confirmed by Bernette Mayers  MD, Leonette Most [id number]) on 01/12/2014 8:34:15 AM      MDM   This is a 47yo AAF w/ PMH untreated HTN who presents with chest pain with typical features, which is concerning for ACS. EKG w/ T wave flattening in leads V4-V6, which is different than her EKG done on 02/2013. CXR wnl. Pt also hypertensive, though the rest of her vital signs are stable. She is nontoxic appearing iSTAT troponin, CBC, BMP, BNP still pending.  10:39 AM Patient still with mild residual L shoulder pain. Remains hypertensive to 202/92. Troponin negative. CBC and BMP unrevealing, only significant for mild hypokalemia and normocytic anemia (stable). BNP 112. Patient also given ASA 324mg . Given patient's hx of exertional CP and SOB  combined with her EKG changes we will plan for admission to IMTS. I spoke with Dr. Janalyn Harder who accepted the admission.  Windell Hummingbird, MD 01/12/14 1043

## 2014-01-12 NOTE — ED Provider Notes (Signed)
I saw and evaluated the patient, reviewed the resident's note and I agree with the findings and plan.   EKG Interpretation   Date/Time:  Wednesday January 12 2014 08:23:27 EDT Ventricular Rate:  92 PR Interval:  158 QRS Duration: 84 QT Interval:  346 QTC Calculation: 427 R Axis:   65 Text Interpretation:  Normal sinus rhythm Nonspecific T wave abnormality  Abnormal ECG Since last tracing Nonspecific T wave abnormality NOW PRESENT  Confirmed by Bernette MayersSHELDON  MD, Leonette MostHARLES [phone]([id number]) on 01/12/2014 8:34:15 AM      Pt with untreated HTN reports intermittent CP/SOB with exertion for 2-3 months, more severe and longer lasting at rest this AM. Pain resolved at the time of her ED eval. EKG with T wave flattening, neg trop. Admit for rule out.   Charles B. Bernette MayersSheldon, MD 01/12/14 1056

## 2014-01-12 NOTE — ED Notes (Signed)
Transfer to 3w34

## 2014-01-12 NOTE — ED Notes (Addendum)
Pt reports centralized CP with radiation to left arm intermittently since January. Reports SOB with exertion. Denies N/V/diaphoresis. Hx: HTN, denies taking RX

## 2014-01-12 NOTE — ED Notes (Signed)
Pt c/o intermittent upper left sided CP that radiates to her left arm X 1 month, describes pain as a pulling/heaviness feeling, sts sometimes she feels a little sob when the CP starts. Denies any precipitating factors that brings the pain on, but with resting the pain eases on its own. Denies hx of HTN/cardiac problems/HA. Nad, skin warm and dry, resp e/u.

## 2014-01-12 NOTE — H&P (Signed)
Date: 01/12/2014               Patient Name:  Courtney Drake MRN: [id number]  DOB: Jan 04, 1966 Age / Sex: 48 y.o., female   PCP: No Pcp Per Patient         Medical Service: Internal Medicine Teaching Service         Attending Physician: Dr. Aletta Edouard, MD    First Contact: Dr. Glendell Docker Pager: [phone]  Second Contact: Dr. Burtis Junes Pager: [phone]       After Hours (After 5p/  First Contact Pager: [phone]  weekends / holidays): Second Contact Pager: [phone]   Chief Complaint: chest pain  History of Present Illness: The patient is a 48 yo woman, history of HTN, with no PCP, presenting for chest pain.  The patient awoke from sleep at 4:00 AM on the morning of admission with a mid-sternal chest "heaviness", radiating to her left shoulder and left arm.  The pain was unchanged by sitting up in bed, and self-resolved after about 2-3 hours.  This associated with shortness of breath, but no nausea, vomiting, or diaphoresis.  Unchanged by eating, change in position, or taking a deep breath.  The patient notes similar episodes over the last 2-3 months, more frequent in the last week, of mid-sternal chest tightness with any exertion, including just walking on level ground, associated with SOB, relieved by stopping to rest.  The patient has a history of HTN, not currently taking medications, with BP in the ED = 201/96.  She is a never smoker, does not take aspirin, has no significant FH of CAD (mother with MI at age 71).  Initial troponin in ED negative, though EKG showed mild T-wave flattening in V4-V6 (mildly changed from prior EKG).  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  Presently, the patient's chest pain has resolved, though left arm "heaviness" persists.  Meds: No current facility-administered medications for this encounter.   No current outpatient prescriptions on file.     Allergies: Allergies as of 01/12/2014  . (No Known Allergies)   Past Medical History  Diagnosis Date  . Ectopic pregnancy affecting fetus or newborn   . History of chicken pox   . Hypertension   . Menorrhagia   . Claustrophobia    Past Surgical History  Procedure Laterality Date  . Fallopian tube removal     Family History  Problem Relation Age of Onset  . Breast cancer Father   . Heart attack Mother 22  . Diabetes Mother   . Hypertension Mother   . Stroke Mother   . Kidney failure Mother   . Asthma Daughter   . Kidney failure Maternal Uncle    History   Social History  . Marital Status: Married    Spouse Name: N/A    Number of Children: N/A  . Years of Education: N/A   Occupational History  . Not on file.   Social History Main Topics  . Smoking status: Never Smoker   . Smokeless tobacco: Not on file  . Alcohol Use: No  . Drug Use: No  . Sexual Activity: Yes    Birth Control/ Protection: None   Other Topics Concern  . 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  Does not have a PCP.    Review of Systems:  General: no fevers, chills, changes in weight, changes in appetite Skin: no rash HEENT: no blurry vision, hearing changes, sore throat Pulm: no dyspnea, coughing, wheezing CV: see HPI Abd: no abdominal pain, nausea/vomiting, diarrhea/constipation GU: no dysuria, hematuria, polyuria Ext: no arthralgias, myalgias Neuro: no weakness, numbness, or tingling  Physical Exam: Blood pressure 198/91, pulse 65, temperature 98.1 F (36.7 C), temperature source Oral, resp. rate 15, height 5\' 3"  (1.6 m), weight 198 lb (89.812 kg), last menstrual period 01/05/2014, SpO2 98.00%. General: alert, cooperative, appears mildly anxious HEENT: pupils equal round and reactive to light, vision grossly intact, oropharynx clear and non-erythematous  Neck: supple Lungs: clear to ascultation bilaterally, normal work of respiration, no  wheezes, rales, ronchi Heart: regular rate and rhythm, no murmurs, gallops, or rubs Abdomen: soft, non-tender, non-distended, normal bowel sounds Extremities: no cyanosis, clubbing, or edema Neurologic: alert & oriented X3, cranial nerves II-XII intact, strength grossly intact, sensation intact to light touch   Lab results: Basic Metabolic Panel:  Recent Labs  16/10/96 0848  NA 137  K 3.5*  CL 100  CO2 24  GLUCOSE 112*  BUN 8  CREATININE 0.60  CALCIUM 9.0   CBC:  Recent Labs  01/12/14 0848  WBC 10.6*  HGB 10.8*  HCT 34.0*  MCV 80.2  PLT 324   BNP:  Recent Labs  01/12/14 0848  PROBNP 116.8    Imaging results:  Dg Chest 2 View  01/12/2014   CLINICAL DATA:  Chest pain  EXAM: CHEST  2 VIEW  COMPARISON:  CT CHEST W/CM dated 03/09/2013; DG CHEST 2 VIEW dated 03/09/2013  FINDINGS: The lungs are well-expanded. There is no focal infiltrate. There is no pleural effusion. The cardiac silhouette is normal in size. The pulmonary vascularity is not engorged. The mediastinum is normal in width. There is no pleural effusion. The observed portions of the bony thorax appear normal.  IMPRESSION: There is no active cardiopulmonary disease.   Electronically Signed   By: David  [place]   On: 01/12/2014 08:58    Other results: EKG: NSR, mild T-wave flattening in V4-V6 more pronounced than on prior EKG  Assessment & Plan by Problem:  # Unstable Angina - The patient presents with a 23-month history of worsening exertional chest pain and SOB, now with symptoms at rest, concerning for unstable angina.  EKG showed some T-wave flattening, but no acute ST abnormalities, and initial troponin was negative.  She has known ACS risk factors of only HTN, and her TIMI score is only 2 (T-wave abnormality, anginal episodes), but I believe her lack of contact with the healthcare system and unknown risk factors is giving her a falsely low TIMI score.  Given her story of anginal progressing from exertional to  at rest, I have significant concerns for ACS. -admit to telemetry -cycle troponins x3 -repeat EKG in AM -check A1C, FLP -start heparin drip -s/p aspirin in ED, continue aspirin daily -start atorvastain, metoprolol -given my high clinical suspicion for CAD, I will consult cardiology for consideration for cardiac cath vs exercise stress test  # Accelerated Hypertension - The patient's maximum BP on admission was 201/95.  The patient has a history of known HTN, though currently not prescribed any medications.  Will lower BP to a goal today of 160/80's. -start IV metoprolol 5 q6hrs, can uptitrate as needed -hydralazine 5 prn for SBP > 180  # Hypokalemia -replace with K-dur -check Mg  # Prophy - heparin drip  Dispo: Disposition is deferred at this time,  awaiting improvement of current medical problems. Anticipated discharge in approximately 1-2 day(s).   The patient does not have a current PCP (No Pcp Per Patient) and [facility] after discharge.  Signed: Linward Headlandyan K Andrzej Scully, MD 01/12/2014, 11:14 AM

## 2014-01-12 NOTE — Consult Note (Signed)
CARDIOLOGY CONSULT NOTE   Patient ID: Courtney Drake MRN: [id number], DOB/AGE: 02-Jan-1966   Admit date: 01/12/2014 Date of Consult: 01/12/2014   Primary Physician: No PCP Per Patient Primary Cardiologist: new - seen by P. [place], MD   Pt. Profile  48 y/o female w/o prior cardiac hx who was admitted earlier today with chest pain.  Problem List  Past Medical History  Diagnosis Date  . Ectopic pregnancy affecting fetus or newborn   . History of chicken pox   . Hypertension   . Menorrhagia   . Claustrophobia     Past Surgical History  Procedure Laterality Date  . Fallopian tube removal      Allergies  No Known Allergies  HPI   48 y/o female without prior cardiac history.  She has been told that she is hypertensive but does not take any medicine at home.  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  She was in her usual state of health until about 2 months ago when she began to experience both rest and exertional substernal chest pressure sometimes radiating to her left shoulder and sometimes associated with dyspnea.  She says that simply walking to her car will cause her to have exertional chest pressure and dyspnea, often lasting about 5 mins prior to resolving with rest.  Rest symptoms are most likely to occur when she lies down for bed at night and improve by either changing position or getting up for a brief period.  This morning, she awoke at about 4 AM with recurrent chest discomfort that did not immediately improve.  She presented to the ED where she was hypertensive with a BP of 201/96.  ECG did not show any acute changes.  Initial troponin was negative.  She was treated with aspirin and IV lopressor with improvement in Ss and then was admitted by internal medicine.  She is currently pain free.  Inpatient Medications  . [START ON 01/13/2014] aspirin EC  81 mg Oral Daily  . atorvastatin  80 mg Oral q1800  . metoprolol  5 mg Intravenous 4 times per day  .  potassium chloride  40 mEq Oral Once   Family History Family History  Problem Relation Age of Onset  . Breast cancer Father   . Heart attack Mother 66  . Diabetes Mother   . Hypertension Mother   . Stroke Mother   . Kidney failure Mother   . Asthma Daughter   . Kidney failure Maternal Uncle     Social History History   Social History  . Marital Status: Married    Spouse Name: N/A    Number of Children: N/A  . Years of Education: N/A   Occupational History  . Not on file.   Social History Main Topics  . Smoking status: Never Smoker   . Smokeless tobacco: Not on file  . Alcohol Use: No  . Drug Use: No  . Sexual Activity: Yes    Birth Control/ Protection: None   Other Topics Concern  . 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  Does not have a PCP.    Review of Systems  General:  No chills, fever, night sweats or weight changes.  Cardiovascular:  +++ chest pain, +++ dyspnea on exertion, no edema, orthopnea, palpitations, paroxysmal nocturnal dyspnea. Dermatological: No rash, lesions/masses Respiratory: No cough, +++ dyspnea Urologic: No hematuria, dysuria Abdominal:   No nausea, vomiting, diarrhea, bright red  blood per rectum, melena, or hematemesis Neurologic:  No visual changes, wkns, changes in mental status. All other systems reviewed and are otherwise negative except as noted above.  Physical Exam  Blood pressure 183/98, pulse 65, temperature 98.1 F (36.7 C), temperature source Oral, resp. rate 15, height 5\' 3"  (1.6 m), weight 198 lb (89.812 kg), last menstrual period 01/05/2014, SpO2 98.00%.  General: Pleasant, NAD Psych: Normal affect. Neuro: Alert and oriented X 3. Moves all extremities spontaneously. HEENT: Normal  Neck: Supple without bruits or JVD. Lungs:  Resp regular and unlabored, CTA. Heart: RRR no s3, s4, or murmurs. Abdomen: Soft, non-tender, non-distended, BS + x 4.  Extremities: No clubbing, cyanosis or  edema. DP/PT/Radials 2+ and equal bilaterally.  Labs  Lab Results  Component Value Date   WBC 10.6* 01/12/2014   HGB 10.8* 01/12/2014   HCT 34.0* 01/12/2014   MCV 80.2 01/12/2014   PLT 324 01/12/2014    Recent Labs Lab 01/12/14 0848  NA 137  K 3.5*  CL 100  CO2 24  BUN 8  CREATININE 0.60  CALCIUM 9.0  GLUCOSE 112*   poc Trop i: 0.01  pBNP 116.8  Radiology/Studies  Dg Chest 2 View  01/12/2014   CLINICAL DATA:  Chest pain  EXAM: CHEST  2 VIEW   IMPRESSION: There is no active cardiopulmonary disease.   Electronically Signed   By: David  [place]   On: 01/12/2014 08:58   ECG  Rsr, 92, nonspecific t changes.  ASSESSMENT AND PLAN  1.  [place]:  Pt presents with a 2 month h/o rest and exertional sscp and doe.  Ss, specifically exertional Ss, are concerning for angina though her resting symptoms are more difficult to put a finger on - prone to lasting longer periods of time and seemingly positional at times. Initial troponin negative and ECG non-acute.  Agree with cycling CE.  Cont asa, statin, coreg.  Will pursue an exercise cardiolite in the AM to r/o ischemia.  If abnl, will pursue cath.  Hold BB in AM.  2.  HTN:  Not previously treated.  Agree with carvedilol.  Will likely require a second agent.  3.  Lipids:  Statin started.  F/U lipids/lft's.  Signed, Nicolasa Duckinghristopher Abdullah Rizzi, NP 01/12/2014, 1:04 PM

## 2014-01-12 NOTE — Progress Notes (Signed)
ANTICOAGULATION CONSULT NOTE - Follow Up Consult  Pharmacy Consult for Heparin Indication: chest pain/ACS  No Known Allergies  Patient Measurements: Height: 5\' 3"  (160 cm) Weight: 198 lb (89.812 kg) IBW/kg (Calculated) : 52.4 Heparin Dosing Weight: 73 kg  Vital Signs: Temp: 99.3 F (37.4 C) (03/18 1306) Temp src: Oral (03/18 1306) BP: 192/94 mmHg (03/18 1306) Pulse Rate: 59 (03/18 1306)  Labs:  Recent Labs  01/12/14 0848  HGB 10.8*  HCT 34.0*  PLT 324  CREATININE 0.60    Estimated Creatinine Clearance: 93.5 ml/min (by C-G formula based on Cr of 0.6).   Medications:  Infusions:    Assessment: [age] year old female with a history of hypertension admitted with accelerated hypertension and chest pain.  She is to begin anticoagulation with heparin.  Goal of Therapy:  Heparin level 0.3-0.7 units/ml Monitor platelets by anticoagulation protocol: Yes   Plan:  Check baseline coag studies Heparin 4000 units IV bolus Start Heparin infusion at 900 units/hr Check Heparin level in 6 hours Daily heparin level and CBC  Estella HuskMichelle Dantavious Snowball, Pharm.D., BCPS, AAHIVP Clinical Pharmacist Phone: [phone] or [phone] 01/12/2014, 1:22 PM

## 2014-01-13 ENCOUNTER — Inpatient Hospital Stay (HOSPITAL_COMMUNITY): Payer: BC Managed Care – PPO

## 2014-01-13 DIAGNOSIS — R079 Chest pain, unspecified: Secondary | ICD-10-CM

## 2014-01-13 DIAGNOSIS — R0989 Other specified symptoms and signs involving the circulatory and respiratory systems: Secondary | ICD-10-CM

## 2014-01-13 DIAGNOSIS — R072 Precordial pain: Secondary | ICD-10-CM | POA: Diagnosis present

## 2014-01-13 DIAGNOSIS — R0609 Other forms of dyspnea: Secondary | ICD-10-CM

## 2014-01-13 LAB — BASIC METABOLIC PANEL
BUN: 10 mg/dL (ref 6–23)
CALCIUM: 9.3 mg/dL (ref 8.4–10.5)
CO2: 25 meq/L (ref 19–32)
CREATININE: 0.74 mg/dL (ref 0.50–1.10)
Chloride: 99 mEq/L (ref 96–112)
GFR calc Af Amer: >90 mL/min (ref >90)
GFR calc non Af Amer: >90 mL/min (ref >90)
GLUCOSE: 75 mg/dL (ref 70–99)
Potassium: 4 mEq/L (ref 3.7–5.3)
SODIUM: 138 meq/L (ref 137–147)

## 2014-01-13 LAB — LIPID PANEL
CHOLESTEROL: 170 mg/dL (ref 0–200)
HDL: 65 mg/dL (ref >39)
LDL Cholesterol: 83 mg/dL (ref 0–99)
Total CHOL/HDL Ratio: 2.6 RATIO
Triglycerides: 109 mg/dL (ref <150)
VLDL: 22 mg/dL (ref 0–40)

## 2014-01-13 LAB — CBC
HCT: 35.6 % — ABNORMAL LOW (ref 36.0–46.0)
Hemoglobin: 11.3 g/dL — ABNORMAL LOW (ref 12.0–15.0)
MCH: 25.2 pg — AB (ref 26.0–34.0)
MCHC: 31.7 g/dL (ref 30.0–36.0)
MCV: 79.3 fL (ref 78.0–100.0)
PLATELETS: 333 10*3/uL (ref 150–400)
RBC: 4.49 MIL/uL (ref 3.87–5.11)
RDW: 16.1 % — ABNORMAL HIGH (ref 11.5–15.5)
WBC: 13.4 10*3/uL — ABNORMAL HIGH (ref 4.0–10.5)

## 2014-01-13 MED ORDER — CARVEDILOL 6.25 MG PO TABS: 6.2500 mg | ORAL_TABLET | Freq: Two times a day (BID) | ORAL | Status: DC

## 2014-01-13 MED ORDER — TECHNETIUM TC 99M SESTAMIBI GENERIC - CARDIOLITE
30.0000 | Freq: Once | INTRAVENOUS | Status: AC | PRN
  Administered 2014-01-13: 30 via INTRAVENOUS

## 2014-01-13 MED ORDER — ASPIRIN 81 MG PO TBEC: 81.0000 mg | DELAYED_RELEASE_TABLET | Freq: Every day | ORAL | Status: DC

## 2014-01-13 MED ORDER — TECHNETIUM TC 99M SESTAMIBI GENERIC - CARDIOLITE
10.0000 | Freq: Once | INTRAVENOUS | Status: AC | PRN
  Administered 2014-01-13: 10 via INTRAVENOUS

## 2014-01-13 MED ORDER — HEPARIN SODIUM (PORCINE) 5000 UNIT/ML IJ SOLN
5000.0000 [IU] | Freq: Three times a day (TID) | INTRAMUSCULAR | Status: DC
  Filled 2014-01-13 (×3): qty 1

## 2014-01-13 MED ORDER — LISINOPRIL 5 MG PO TABS: 5.0000 mg | ORAL_TABLET | Freq: Every day | ORAL | Status: DC

## 2014-01-13 MED ORDER — ATORVASTATIN CALCIUM 80 MG PO TABS: 80.0000 mg | ORAL_TABLET | Freq: Every day | ORAL | Status: DC

## 2014-01-13 NOTE — Progress Notes (Signed)
Reviewed discharge instructions with patient and she stated her understanding.  Discharged home with husband.  Courtney Drake Danielle  

## 2014-01-13 NOTE — Progress Notes (Signed)
Patient Name: Courtney Drake Date of Encounter: 01/13/2014  Principal Problem:   Unstable angina Active Problems:   Uncontrolled hypertension    Patient Profile: 48 yo female with no previous cardiac issues, CRFs are possible HTN (has been told, no Rx), borderline obesity (BMI 31), borderline DM (A1c 6.0) and FH (mother had an MI in her 5160s).  SUBJECTIVE: No chest pain, has resolved. No SOB  OBJECTIVE Filed Vitals:   01/13/14 0200 01/13/14 0405 01/13/14 0635 01/13/14 0957  BP: 122/65 129/48 133/70 157/88  Pulse: 80 69 62 68  Temp:   98.2 F (36.8 C)   TempSrc:   Oral   Resp:   18   Height:      Weight:      SpO2:   100%    No intake or output data in the 24 hours ending 01/13/14 1029 Filed Weights   01/12/14 0829  Weight: 198 lb (89.812 kg)    PHYSICAL EXAM General: Well developed, well nourished, female in no acute distress. Head: Normocephalic, atraumatic.  Neck: Supple without bruits, JVD not elevated. Lungs:  Resp regular and unlabored, CTA. Heart: RRR, S1, S2, no S3, S4, soft murmur; no rub. Abdomen: Soft, non-tender, non-distended, BS + x 4.  Extremities: No clubbing, cyanosis, no edema.  Neuro: Alert and oriented X 3. Moves all extremities spontaneously. Psych: Normal affect.  LABS: CBC: Recent Labs  01/12/14 0848 01/13/14 0800  WBC 10.6* 13.4*  HGB 10.8* 11.3*  HCT 34.0* 35.6*  MCV 80.2 79.3  PLT 324 333   INR: Recent Labs  01/12/14 1505  INR 1.03   Basic Metabolic Panel: Recent Labs  01/12/14 0848 01/12/14 1505 01/13/14 0800  NA 137  --  138  K 3.5*  --  4.0  CL 100  --  99  CO2 24  --  25  GLUCOSE 112*  --  75  BUN 8  --  10  CREATININE 0.60  --  0.74  CALCIUM 9.0  --  9.3  MG  --  1.9  --    Cardiac Enzymes: Recent Labs  01/12/14 1505 01/12/14 2055  TROPONINI <0.30 <0.30    Recent Labs  01/12/14 0906  TROPIPOC 0.01   BNP: Pro B Natriuretic peptide (BNP)  Date/Time Value Ref Range Status  01/12/2014  8:48 AM  116.8  0 - 125 pg/mL Final   Hemoglobin A1C: Recent Labs  01/12/14 1505  HGBA1C 6.0*   Fasting Lipid Panel: Recent Labs  01/13/14 0800  CHOL 170  HDL 65  LDLCALC 83  TRIG 109  CHOLHDL 2.6   TELE:  SR no ectopy (pt seen in nuc med)      Radiology/Studies: Dg Chest 2 View  01/12/2014   CLINICAL DATA:  Chest pain  EXAM: CHEST  2 VIEW  COMPARISON:  CT CHEST W/CM dated 03/09/2013; DG CHEST 2 VIEW dated 03/09/2013  FINDINGS: The lungs are well-expanded. There is no focal infiltrate. There is no pleural effusion. The cardiac silhouette is normal in size. The pulmonary vascularity is not engorged. The mediastinum is normal in width. There is no pleural effusion. The observed portions of the bony thorax appear normal.  IMPRESSION: There is no active cardiopulmonary disease.   Electronically Signed   By: David  [place]   On: 01/12/2014 08:58     Current Medications:  . aspirin EC  81 mg Oral Daily  . atorvastatin  80 mg Oral q1800  . carvedilol  3.125 mg Oral  BID WC  . lisinopril  5 mg Oral Daily      ASSESSMENT AND PLAN: Principal Problem:   Unstable angina - symptoms resolved and ez negative for MI. Pt for stress test today, she wishes to try treadmill, will attempt, do Lexiscan in does not reach target.   Active Problems:   Uncontrolled hypertension - SBP doing well on low doses on lisinopril and Coreg. BUN, Cr are OK. MD advise if both are D/C meds. Per IM.  Plan - if stress test negative and EF nl, no further cardiac workup.  Signed, Theodore Demark , PA-C 10:29 AM 01/13/2014   Chart Reviewed - pt in Nuc Med when I went by.   Needs BP controlled - ? If CP could simply be HTN related DHF/Demand ischemia.  More to follow after ST results in.  Would recommend Echo to determine extent of HTN Heart Disease. (can do as OP if Myoview is Negative).  Will need BB & ACE-I dose increased -- 6.25 mg bid & 10mg  respectively.  Agree with statin & ASA,.   Marykay Lex, MD]

## 2014-01-13 NOTE — Progress Notes (Addendum)
GXT CL performed. Target HR reached in < 2 minutes and stage I held. However, Pt BP went to 261/103 w/ exertion and test ended in < 6 minutes (all in stage I). No chest pain. ECG not acute. Images pending.

## 2014-01-13 NOTE — Discharge Summary (Signed)
Name: Courtney Drake MRN: [id number] DOB: Aug 02, 1966 48 y.o. PCP: No Pcp Per Patient  Date of Admission: 01/12/2014  8:28 AM Date of Discharge: 01/13/2014 Attending Physician: Aletta EdouardShilpa Bhardwaj, MD  Discharge Diagnosis:  Principal Problem:   Chest pain Active Problems:   Uncontrolled hypertension  Discharge Medications:   Medication List         aspirin 81 MG EC tablet  Take 1 tablet (81 mg total) by mouth daily.     atorvastatin 80 MG tablet  Commonly known as:  LIPITOR  Take 1 tablet (80 mg total) by mouth daily at 6 PM.     carvedilol 6.25 MG tablet  Commonly known as:  COREG  Take 1 tablet (6.25 mg total) by mouth 2 (two) times daily with a meal.     lisinopril 5 MG tablet  Commonly known as:  PRINIVIL,ZESTRIL  Take 1 tablet (5 mg total) by mouth daily.        Disposition and follow-up:   [name].  At the hospital follow up visit please address:  1.  Chest Pain, HTN, HgA1c of 6.0  2.  Labs / imaging needed at time of follow-up: BMP  3.  Pending labs/ test needing follow-up: Lipid Panel  Follow-up Appointments:     Follow-up Information   Follow up with Pleas KochKomanski, Eliyah Bazzi, MD On 01/26/2014. (2:45 pm)    Specialty:  Internal Medicine   Contact information:   [address] [place] [address] [phone]       Discharge Instructions: Discharge Orders   Future Appointments Provider Department Dept Phone   01/26/2014 2:45 PM Pleas Kochhristopher Rajon Bisig, MD [facility] [phone]   Future Orders Complete By Expires   Call MD for:  difficulty breathing, headache or visual disturbances  As directed    Call MD for:  persistant dizziness or light-headedness  As directed    Call MD for:  severe uncontrolled pain  As directed    Call MD for:  As directed    Comments:     New or worsening symptoms.   Diet - low sodium heart healthy  As directed    Discharge instructions  As directed    Comments:     You have been diagnosed with very high blood pressure that mya have been causing your chest pain. Please take the medications as prescribed. Please follow up with the PCP we arranged for you.   Increase activity slowly  As directed       Consultations: Treatment Team:  Rounding Lbcardiology, MD  Procedures Performed:  Dg Chest 2 View  01/12/2014   CLINICAL DATA:  Chest pain  EXAM: CHEST  2 VIEW  COMPARISON:  CT CHEST W/CM dated 03/09/2013; DG CHEST 2 VIEW dated 03/09/2013  FINDINGS: The lungs are well-expanded. There is no focal infiltrate. There is no pleural effusion. The cardiac silhouette is normal in size. The pulmonary vascularity is not engorged. The mediastinum is normal in width. There is no pleural effusion. The observed portions of the bony thorax appear normal.  IMPRESSION: There is no active cardiopulmonary disease.   Electronically Signed   By: David  [place]   On: 01/12/2014 08:58   Nm Myocar Multi W/spect W/wall Motion / Ef  01/13/2014   CLINICAL DATA:  Chest pain  EXAM: MYOCARDIAL IMAGING WITH SPECT (REST AND EXERCISE)  GATED LEFT VENTRICULAR WALL MOTION STUDY  LEFT VENTRICULAR EJECTION FRACTION  TECHNIQUE: Standard myocardial SPECT imaging was performed after resting intravenous injection of 10 mCi Tc-78m sestamibi. Subsequently, exercise tolerance test was performed by the patient under the supervision of the Cardiology staff. At peak-stress, 30 mCi Tc-61m sestamibi was injected intravenously and standard myocardial SPECT imaging was performed. Quantitative gated imaging was also performed to evaluate left ventricular wall motion, and estimate left ventricular ejection fraction.  COMPARISON:  None.  FINDINGS: SPECT: Allowing for breast attenuation artifact, there are no perfusion defects.  Wall motion:  Nonspecific septal hypokinesis.  Ejection fraction: 61%. End-diastolic volume 70 cc. End systolic volume 26 cc.  IMPRESSION:  Allowing for breast attenuation artifact. There are no perfusion defects.   Electronically Signed   By: Maryclare Bean M.D.   On: 01/13/2014 15:27    2D Echo: Left ventricle: The cavity size was normal. The estimated ejection fraction was 55%. Wall motion was normal; there were no regional wall motion abnormalities.  Myoview: Allowing for breast attenuation artifact. There are no perfusion  defects.   Admission HPI: The patient is a 48 yo woman, history of HTN, with no PCP, presenting for chest pain. The patient awoke from sleep at 4:00 AM on the morning of admission with a mid-sternal chest "heaviness", radiating to her left shoulder and left arm. The pain was unchanged by sitting up in bed, and self-resolved after about 2-3 hours. This associated with shortness of breath, but no nausea, vomiting, or diaphoresis. Unchanged by eating, change in position, or taking a deep breath. The patient notes similar episodes over the last 2-3 months, more frequent in the last week, of mid-sternal chest tightness with any exertion, including just walking on level ground, associated with SOB, relieved by stopping to rest. The patient has a history of HTN, not currently taking medications, with BP in the ED = 201/96. She is a never smoker, does not take aspirin, has no significant FH of CAD (mother with MI at age 54). Initial troponin in ED negative, though EKG showed mild T-wave flattening in V4-V6 (mildly changed from prior EKG). 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 Presently, the patient's chest pain has resolved, though left arm "heaviness" persists.   Hospital Course by problem list: Principal Problem:   Chest pain Active Problems:   Uncontrolled hypertension   # Unstable Angina - The patient presented with a 47-month history of worsening exertional chest pain and SOB, now with  symptoms at rest, concerning for unstable angina. EKG showed some T-wave flattening, but no acute ST abnormalities, and initial troponin was negative. She has known ACS risk factors of only HTN, and her TIMI score is only 2 (T-wave abnormality, anginal episodes), but I believe her lack of contact with the healthcare system and unknown risk factors is giving her a falsely low TIMI score. Given her story of anginal progressing from exertional to at rest, there was significant concern for ACS. Heparin drip was started and cardiology consulted. Cardiology elected to complete a myoview and echocardiogram. No cardiac disease was identified. The patient was chest pain free while in hospital.   # Accelerated Hypertension - The patient's maximum BP on admission was 201/95. The patient has a history of known HTN, though currently not prescribed any medications. Started coreg 6.25 BID and lisinopril 5 mg qd. Will likely need uptitration as outpatient.   Discharge Vitals:   BP 152/79  Pulse 74  Temp(Src) 98.8 F (37.1 C) (Oral)  Resp 18  Ht 5\' 3"  (1.6 m)  Wt 198 lb (89.812 kg)  BMI 35.08 kg/m2  SpO2 100%  LMP 01/05/2014  Discharge Labs:  Results for orders placed during the hospital encounter of 01/12/14 (from the past 24 hour(s))  TROPONIN I     Status: None   Collection Time    01/12/14  8:55 PM      Result Value Ref Range   Troponin I <0.30  <0.30 ng/mL  LIPID PANEL     Status: None   Collection Time    01/13/14  8:00 AM      Result Value Ref Range   Cholesterol 170  0 - 200 mg/dL   Triglycerides 161  <096 mg/dL   HDL 65  >04 mg/dL   Total CHOL/HDL Ratio 2.6     VLDL 22  0 - 40 mg/dL   LDL Cholesterol 83  0 - 99 mg/dL  BASIC METABOLIC PANEL     Status: None   Collection Time    01/13/14  8:00 AM      Result Value Ref Range   Sodium 138  137 - 147 mEq/L   Potassium 4.0  3.7 - 5.3 mEq/L   Chloride 99  96 - 112 mEq/L   CO2 25  19 - 32 mEq/L   Glucose, Bld 75  70 - 99 mg/dL   BUN 10  6  - 23 mg/dL   Creatinine, Ser 5.40  0.50 - 1.10 mg/dL   Calcium 9.3  8.4 - 98.1 mg/dL   GFR calc non Af Amer >90  >90 mL/min   GFR calc Af Amer >90  >90 mL/min  CBC     Status: Abnormal   Collection Time    01/13/14  8:00 AM      Result Value Ref Range   WBC 13.4 (*) 4.0 - 10.5 K/uL   RBC 4.49  3.87 - 5.11 MIL/uL   Hemoglobin 11.3 (*) 12.0 - 15.0 g/dL   HCT 19.1 (*) 47.8 - 29.5 %   MCV 79.3  78.0 - 100.0 fL   MCH 25.2 (*) 26.0 - 34.0 pg   MCHC 31.7  30.0 - 36.0 g/dL   RDW 62.1 (*) 30.8 - 65.7 %   Platelets 333  150 - 400 K/uL    Signed: Pleas Koch, MD 01/13/2014, 3:36 PM   Time Spent on Discharge: 35 minutes Services Ordered on Discharge: None Equipment Ordered on Discharge: None

## 2014-01-13 NOTE — H&P (Signed)
INTERNAL MEDICINE TEACHING ATTENDING NOTE  Day 1 of stay  Patient name: Courtney Drake  MRN: [id number] Date of birth: [birth year]   48 y.o.woman with HTN, and chest pain. Admitted for hypertensive urgency and chest pain radiating to the left arm. The chest pain resolved sponatneously, however the patient experiences heaviness in the left arm. She has been ruled out of AMI with negative troponins, after she had an initial EKG showing TI in V5V6. This is resolved now. She was started on heparin which has been discontinued. When I met the patient this AM, she reported no complaints. Her left arm feels normal right now. On exam, the left arm has no neurological deficits. She does not have chest pain and her blood pressure has been aggressively managed by cardiology. She has had a stress test, awaiting report. If the report is negative she is stable to be discharged pending cardiology approval. I have discussed this with Dr. Algis Liming and Dr. Margart Sickles. Patient needs PCP follow up on discharge and she wishes to continue at [facility].   I have seen and evaluated this patient and discussed it with my IM resident team.  Please see the rest of the plan per resident note from today.   [name], [place] 01/13/2014, 2:18 PM.

## 2014-01-13 NOTE — Progress Notes (Signed)
Subjective:  NAE ON. Patient feels much better. No chest pain. Request to go home.   Objective: Vital signs in last 24 hours: Filed Vitals:   01/13/14 0957 01/13/14 1038 01/13/14 1041 01/13/14 1043  BP: 157/88 157/88 201/91 263/103  Pulse: 68 109 155 162  Temp:      TempSrc:      Resp:      Height:      Weight:      SpO2:       Weight change:  No intake or output data in the 24 hours ending 01/13/14 1317 Physical Exam  Constitutional: She is oriented to person, place, and time. She appears well-developed and well-nourished. No distress.  HENT:  Head: Normocephalic and atraumatic.  Cardiovascular: Normal rate, regular rhythm, normal heart sounds and intact distal pulses.  Exam reveals no friction rub.   No murmur heard. Pulmonary/Chest: Effort normal and breath sounds normal. No respiratory distress. She has no wheezes. She has no rales.  Abdominal: Soft. Bowel sounds are normal. She exhibits no distension. There is no tenderness.  Neurological: She is alert and oriented to person, place, and time.  Skin: She is not diaphoretic.  Psychiatric: She has a normal mood and affect. Her behavior is normal.    Lab Results: Basic Metabolic Panel:  Recent Labs Lab 01/12/14 0848 01/12/14 1505 01/13/14 0800  NA 137  --  138  K 3.5*  --  4.0  CL 100  --  99  CO2 24  --  25  GLUCOSE 112*  --  75  BUN 8  --  10  CREATININE 0.60  --  0.74  CALCIUM 9.0  --  9.3  MG  --  1.9  --    CBC:  Recent Labs Lab 01/12/14 0848 01/13/14 0800  WBC 10.6* 13.4*  HGB 10.8* 11.3*  HCT 34.0* 35.6*  MCV 80.2 79.3  PLT 324 333   Cardiac Enzymes:  Recent Labs Lab 01/12/14 1505 01/12/14 2055  TROPONINI <0.30 <0.30   BNP:  Recent Labs Lab 01/12/14 0848  PROBNP 116.8   Hemoglobin A1C:  Recent Labs Lab 01/12/14 1505  HGBA1C 6.0*   Fasting Lipid Panel:  Recent Labs Lab 01/13/14 0800  CHOL 170  HDL 65  LDLCALC 83  TRIG 109  CHOLHDL 2.6   Coagulation:  Recent  Labs Lab 01/12/14 1505  LABPROT 13.3  INR 1.03   Studies/Results: Dg Chest 2 View  01/12/2014   CLINICAL DATA:  Chest pain  EXAM: CHEST  2 VIEW  COMPARISON:  CT CHEST W/CM dated 03/09/2013; DG CHEST 2 VIEW dated 03/09/2013  FINDINGS: The lungs are well-expanded. There is no focal infiltrate. There is no pleural effusion. The cardiac silhouette is normal in size. The pulmonary vascularity is not engorged. The mediastinum is normal in width. There is no pleural effusion. The observed portions of the bony thorax appear normal.  IMPRESSION: There is no active cardiopulmonary disease.   Electronically Signed   By: David  [place]   On: 01/12/2014 08:58   Medications: I have reviewed the patient's current medications. Scheduled Meds: . aspirin EC  81 mg Oral Daily  . atorvastatin  80 mg Oral q1800  . carvedilol  3.125 mg Oral BID WC  . lisinopril  5 mg Oral Daily   Continuous Infusions:  PRN Meds:.hydrALAZINE, nitroGLYCERIN, ondansetron (ZOFRAN) IV Assessment/Plan: Principal Problem:   Unstable angina Active Problems:   Uncontrolled hypertension  Chest Pain - Resolved Patient is doing much better.  Cards eval. Ptnt completed stress test today. - Stress Test pending - Cards rec's echo  Hypertension BP well controlled with lisinopril and coreg. Plan to continue.  # Prophy - heparin    Dispo: Disposition is deferred at this time, awaiting improvement of current medical problems.  Anticipated discharge in approximately 1 day(s).   The patient does not have a current PCP (No Pcp Per Patient) and does [facility] after discharge.  The patient does have transportation limitations that hinder transportation to clinic appointments.  .Services Needed at time of discharge: Y = Yes, Blank = No PT:   OT:   RN:   Equipment:   Other:     LOS: 1 day   Pleas Koch, MD 01/13/2014, 1:17 PM

## 2014-01-13 NOTE — Discharge Instructions (Addendum)
Hypertension As your heart beats, it forces blood through your arteries. This force is your blood pressure. If the pressure is too high, it is called hypertension (HTN) or high blood pressure. HTN is dangerous because you may have it and not know it. High blood pressure may mean that your heart has to work harder to pump blood. Your arteries may be narrow or stiff. The extra work puts you at risk for heart disease, stroke, and other problems.  Blood pressure consists of two numbers, a higher number over a lower, 110/72, for example. It is stated as "110 over 72." The ideal is below 120 for the top number (systolic) and under 80 for the bottom (diastolic). Write down your blood pressure today. You should pay close attention to your blood pressure if you have certain conditions such as:  Heart failure.  Prior heart attack.  Diabetes  Chronic kidney disease.  Prior stroke.  Multiple risk factors for heart disease. To see if you have HTN, your blood pressure should be measured while you are seated with your arm held at the level of the heart. It should be measured at least twice. A one-time elevated blood pressure reading (especially in the Emergency Department) does not mean that you need treatment. There may be conditions in which the blood pressure is different between your right and left arms. It is important to see your caregiver soon for a recheck. Most people have essential hypertension which means that there is not a specific cause. This type of high blood pressure may be lowered by changing lifestyle factors such as:  Stress.  Smoking.  Lack of exercise.  Excessive weight.  Drug/tobacco/alcohol use.  Eating less salt. Most people do not have symptoms from high blood pressure until it has caused damage to the body. Effective treatment can often prevent, delay or reduce that damage. TREATMENT  When a cause has been identified, treatment for high blood pressure is directed at the  cause. There are a large number of medications to treat HTN. These fall into several categories, and your caregiver will help you select the medicines that are best for you. Medications may have side effects. You should review side effects with your caregiver. If your blood pressure stays high after you have made lifestyle changes or started on medicines,   Your medication(s) may need to be changed.  Other problems may need to be addressed.  Be certain you understand your prescriptions, and know how and when to take your medicine.  Be sure to follow up with your caregiver within the time frame advised (usually within two weeks) to have your blood pressure rechecked and to review your medications.  If you are taking more than one medicine to lower your blood pressure, make sure you know how and at what times they should be taken. Taking two medicines at the same time can result in blood pressure that is too low. SEEK IMMEDIATE MEDICAL CARE IF:  You develop a severe headache, blurred or changing vision, or confusion.  You have unusual weakness or numbness, or a faint feeling.  You have severe chest or abdominal pain, vomiting, or breathing problems. MAKE SURE YOU:   Understand these instructions.  Will watch your condition.  Will get help right away if you are not doing well or get worse. Document Released: 10/14/2005 Document Revised: 01/06/2012 Document Reviewed: 06/03/2008 [facility] Patient Information 2014 [place], [place].    Chest Pain Observation It is often hard to give a specific diagnosis for  the cause of chest pain. Among other possibilities your symptoms might be caused by inadequate oxygen delivery to your heart (angina). Angina that is not treated or evaluated can lead to a heart attack (myocardial infarction) or death. Blood tests, electrocardiograms, and X-rays may have been done to help determine a possible cause of your chest pain. After evaluation and observation, your  health care provider has determined that it is unlikely your pain was caused by an unstable condition that requires hospitalization. However, a full evaluation of your pain may need to be completed, with additional diagnostic testing as directed. It is very important to keep your follow-up appointments. Not keeping your follow-up appointments could result in permanent heart damage, disability, or death. If there is any problem keeping your follow-up appointments, you must call your health care provider. HOME CARE INSTRUCTIONS  Due to the slight chance that your pain could be angina, it is important to follow your health care provider's treatment plan and also maintain a healthy lifestyle:  Maintain or work toward achieving a healthy weight.  Stay physically active and exercise regularly.  Decrease your salt intake.  Eat a balanced, healthy diet. Talk to a dietician to learn about heart healthy foods.  Increase your fiber intake by including whole grains, vegetables, fruits, and nuts in your diet.  Avoid situations that cause stress, anger, or depression.  Take medicines as advised by your health care provider. Report any side effects to your health care provider. Do not stop medicines or adjust the dosages on your own.  Quit smoking. Do not use nicotine patches or gum until you check with your health care provider.  Keep your blood pressure, blood sugar, and cholesterol levels within normal limits.  Limit alcohol intake to no more than 1 drink per day for women that are not pregnant and 2 drinks per day for men.  Do not abuse drugs. SEEK IMMEDIATE MEDICAL CARE IF: You have severe chest pain or pressure which may include symptoms such as:  You feel pain or pressure in you arms, neck, jaw, or back.  You have severe back or abdominal pain, feel sick to your stomach (nauseous), or throw up (vomit).  You are sweating profusely.  You are having a fast or irregular heartbeat.  You feel  short of breath while at rest.  You notice increasing shortness of breath during rest, sleep, or with activity.  You have chest pain that does not get better after rest or after taking your usual medicine.  You wake from sleep with chest pain.  You are unable to sleep because you cannot breathe.  You develop a frequent cough or you are coughing up blood.  You feel dizzy, faint, or experience extreme fatigue.  You develop severe weakness, dizziness, fainting, or chills. Any of these symptoms may represent a serious problem that is an emergency. Do not wait to see if the symptoms will go away. Call your local emergency services (911 in the [place] Do not drive yourself to the hospital. MAKE SURE YOU:  Understand these instructions.  Will watch your condition.  Will get help right away if you are not doing well or get worse. Document Released: 11/16/2010 Document Revised: 06/16/2013 Document Reviewed: 04/15/2013 [facility] Patient Information 2014 [place], [place].

## 2014-01-13 NOTE — Progress Notes (Signed)
Echocardiogram 2D Echocardiogram has been performed.  Courtney Drake, Courtney Drake 01/13/2014, 2:59 PM

## 2014-01-14 NOTE — Discharge Summary (Signed)
INTERNAL MEDICINE ATTENDING DISCHARGE COSIGN   I evaluated the patient on the day of discharge and discussed the discharge plan with my resident team. I agree with the discharge documentation and disposition.   Millan Legan 01/14/2014, 1:58 PM   

## 2014-01-26 ENCOUNTER — Ambulatory Visit: Payer: BC Managed Care – PPO | Admitting: Internal Medicine

## 2014-02-04 ENCOUNTER — Ambulatory Visit (INDEPENDENT_AMBULATORY_CARE_PROVIDER_SITE_OTHER): Payer: BC Managed Care – PPO | Admitting: Internal Medicine

## 2014-02-04 ENCOUNTER — Encounter: Payer: Self-pay | Admitting: Internal Medicine

## 2014-02-04 VITALS — BP 140/80 | HR 68 | Temp 98.9°F | Ht 63.0 in | Wt 200.0 lb

## 2014-02-04 DIAGNOSIS — I10 Essential (primary) hypertension: Secondary | ICD-10-CM

## 2014-02-04 LAB — BASIC METABOLIC PANEL WITH GFR
BUN: 8 mg/dL (ref 6–23)
CHLORIDE: 103 meq/L (ref 96–112)
CO2: 27 meq/L (ref 19–32)
Calcium: 8.9 mg/dL (ref 8.4–10.5)
Creat: 0.74 mg/dL (ref 0.50–1.10)
GFR, Est African American: >89 mL/min
GFR, Est Non African American: >89 mL/min
GLUCOSE: 122 mg/dL — AB (ref 70–99)
Potassium: 3.9 mEq/L (ref 3.5–5.3)
SODIUM: 139 meq/L (ref 135–145)

## 2014-02-04 MED ORDER — LISINOPRIL 5 MG PO TABS: 10.0000 mg | ORAL_TABLET | Freq: Every day | ORAL | Status: DC

## 2014-02-04 NOTE — Progress Notes (Signed)
Patient ID: Courtney PufferOllie M Drake, female   DOB: Sep 18, 1966, 48 y.o.   MRN: [id number]    Subjective:   Patient ID: Courtney Pufferllie M Okelley female   DOB: Sep 18, 1966 48 y.o.   MRN: [id number]  HPI: [name] Courtney Drake is a 48 y.o. woman with recentyl dx HTN who presents for hospital follow up and titration of BP meds. She has had not chest pain since discharge. She has no complaints at all today. BP log indicates SBP 160-180 at home on current regimen.    Past Medical History  Diagnosis Date  . Ectopic pregnancy affecting fetus or newborn   . History of chicken pox   . Hypertension   . Menorrhagia   . Claustrophobia    Current Outpatient Prescriptions  Medication Sig Dispense Refill  . aspirin EC 81 MG EC tablet Take 1 tablet (81 mg total) by mouth daily.      Marland Kitchen. atorvastatin (LIPITOR) 80 MG tablet Take 1 tablet (80 mg total) by mouth daily at 6 PM.  30 tablet  0  . carvedilol (COREG) 6.25 MG tablet Take 1 tablet (6.25 mg total) by mouth 2 (two) times daily with a meal.  60 tablet  0  . lisinopril (PRINIVIL,ZESTRIL) 5 MG tablet Take 1 tablet (5 mg total) by mouth daily.  30 tablet  0   No current facility-administered medications for this visit.   Family History  Problem Relation Age of Onset  . Breast cancer Father   . Heart attack Mother [id number]  . Diabetes Mother   . Hypertension Mother   . Stroke Mother   . Kidney failure Mother   . Asthma Daughter   . Kidney failure Maternal Uncle    History   Social History  . Marital Status: Married    Spouse Name: N/A    Number of Children: N/A  . Years of Education: N/A   Social History Main Topics  . Smoking status: Never Smoker   . Smokeless tobacco: Never Used  . Alcohol Use: No  . Drug Use: No  . Sexual Activity: Yes    Birth Control/ Protection: None   Other Topics Concern  . 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  Does not have a PCP.   Review of Systems: Review of Systems  Constitutional: Negative for fever and  chills.  HENT: Negative for sore throat.   Eyes: Negative for blurred vision, double vision and photophobia.  Respiratory: Negative for cough and shortness of breath.   Cardiovascular: Negative for chest pain, palpitations, orthopnea and leg swelling.  Skin: Negative for rash.  Neurological: Negative for weakness and headaches.    Objective:  Physical Exam: Filed Vitals:   02/04/14 1043  BP: 165/84  Pulse: 68  Temp: 98.9 F (37.2 C)  TempSrc: Oral  Height: 5\' 3"  (1.6 m)  Weight: 200 lb (90.719 kg)  SpO2: 99%   Physical Exam  Constitutional: She is oriented to person, place, and time. She appears well-developed and well-nourished. No distress.  HENT:  Head: Normocephalic and atraumatic.  Cardiovascular: Normal rate, regular rhythm, normal heart sounds and intact distal pulses.  Exam reveals no friction rub.   No murmur heard. Pulmonary/Chest: Effort normal and breath sounds normal. No respiratory distress. She has no wheezes. She has no rales.  Neurological: She is alert and oriented to person, place, and time.  Skin: She is not diaphoretic.  Psychiatric: She has a normal mood and affect. Her behavior is normal.  Assessment & Plan:

## 2014-02-04 NOTE — Assessment & Plan Note (Signed)
The patients BP remains elevated. I reviewed her BP log and numerous readings in the 160-180 range. Plan to increase lisinopril to 10 mg.   Checking BMP today.  Will plan on adding HCTZ at visit in 2 weeks if BP remains elevated.

## 2014-02-04 NOTE — Patient Instructions (Signed)
The patients BP remains elevated. I reviewed her BP log and numerous readings in the 160-180 range. Plan to increase lisinopril to 10 mg. Will check labs today and I will call you if there are abnormalities.

## 2014-02-09 NOTE — Progress Notes (Signed)
Case discussed with Dr. Komanski at the time of the visit.  We reviewed the resident's history and exam and pertinent patient test results.  I agree with the assessment, diagnosis, and plan of care documented in the resident's note.      

## 2014-02-12 ENCOUNTER — Other Ambulatory Visit (HOSPITAL_COMMUNITY): Payer: Self-pay | Admitting: Internal Medicine

## 2014-02-16 ENCOUNTER — Ambulatory Visit (INDEPENDENT_AMBULATORY_CARE_PROVIDER_SITE_OTHER): Payer: BC Managed Care – PPO | Admitting: Internal Medicine

## 2014-02-16 ENCOUNTER — Encounter: Payer: Self-pay | Admitting: Internal Medicine

## 2014-02-16 VITALS — BP 206/111 | HR 66 | Temp 97.4°F | Ht 63.0 in | Wt 201.3 lb

## 2014-02-16 DIAGNOSIS — I10 Essential (primary) hypertension: Secondary | ICD-10-CM

## 2014-02-16 MED ORDER — LISINOPRIL-HYDROCHLOROTHIAZIDE 20-12.5 MG PO TABS: 1.0000 | ORAL_TABLET | Freq: Every day | ORAL | Status: DC

## 2014-02-16 MED ORDER — ASPIRIN 81 MG PO TBEC: 81.0000 mg | DELAYED_RELEASE_TABLET | Freq: Every day | ORAL | Status: DC

## 2014-02-16 NOTE — Assessment & Plan Note (Addendum)
BP Readings from Last 3 Encounters:  02/16/14 206/111  02/04/14 140/80  01/13/14 152/79    Lab Results  Component Value Date   NA 139 02/04/2014   K 3.9 02/04/2014   CREATININE 0.74 02/04/2014    Assessment: Blood pressure control: moderately elevated Progress toward BP goal:  deteriorated Comments: uncontrolled on Lisinopril 10  Plan: Medications:  will change HTN management since pt has no h/o ACS per nuclear studey 12/2013 neg and trop neg.  Will do Lisinopril 20 mg and HCTZ 12.5 mg combo today; continue Asprin, d/c Coreg , D/C Lisinopril 10 mg today, D/C Lipitor 80 mg since LDL at goal 83 Educational resources provided: brochure;other (see comments) Self management tools provided: home blood pressure logbook;other (see comments) Other plans: RTC 1-2 weeks repeat BMET, increase dose of BP medication if uncontrolled at f/u

## 2014-02-16 NOTE — Patient Instructions (Addendum)
General Instructions: Please pick up your new blood pressure medication from the pharmacy  Follow up in 1-2 weeks Read information below Thank you for bringing your medicines today. This helps [place] keep you safe from mistakes.   Treatment Goals:  Goals (1 Years of Data) as of 02/16/14         As of Today As of Today 02/04/14 02/04/14 01/13/14     Blood Pressure    . Blood Pressure < 140/90  206/111 198/98 140/80 165/84 152/79      Progress Toward Treatment Goals:  Treatment Goal 02/16/2014  Blood pressure deteriorated    Self Care Goals & Plans:  Self Care Goal 02/16/2014  Manage my medications take my medicines as prescribed; bring my medications to every visit; refill my medications on time  Monitor my health keep track of my blood pressure  Eat healthy foods drink diet soda or water instead of juice or soda; eat more vegetables; eat foods that are low in salt; eat baked foods instead of fried foods; eat fruit for snacks and desserts; eat smaller portions  Be physically active find an activity I enjoy    No flowsheet data found.   Care Management & Community Referrals:  Referral 02/16/2014  Referrals made for care management support none needed  Referrals made to community resources none       Exercise to Lose Weight Exercise and a healthy diet may help you lose weight. Your doctor may suggest specific exercises. EXERCISE IDEAS AND TIPS  Choose low-cost things you enjoy doing, such as walking, bicycling, or exercising to workout videos.  Take stairs instead of the elevator.  Walk during your lunch break.  Park your car further away from work or school.  Go to a gym or an exercise class.  Start with 5 to 10 minutes of exercise each day. Build up to 30 minutes of exercise 4 to 6 days a week.  Wear shoes with good support and comfortable clothes.  Stretch before and after working out.  Work out until you breathe harder and your heart beats faster.  Drink extra  water when you exercise.  Do not do so much that you hurt yourself, feel dizzy, or get very short of breath. Exercises that burn about 150 calories:  Running 1  miles in 15 minutes.  Playing volleyball for 45 to 60 minutes.  Washing and waxing a car for 45 to 60 minutes.  Playing touch football for 45 minutes.  Walking 1  miles in 35 minutes.  Pushing a stroller 1  miles in 30 minutes.  Playing basketball for 30 minutes.  Raking leaves for 30 minutes.  Bicycling 5 miles in 30 minutes.  Walking 2 miles in 30 minutes.  Dancing for 30 minutes.  Shoveling snow for 15 minutes.  Swimming laps for 20 minutes.  Walking up stairs for 15 minutes.  Bicycling 4 miles in 15 minutes.  Gardening for 30 to 45 minutes.  Jumping rope for 15 minutes.  Washing windows or floors for 45 to 60 minutes. Document Released: 11/16/2010 Document Revised: 01/06/2012 Document Reviewed: 11/16/2010 [facility] Patient Information 2014 [place], [place].  Hydrochlorothiazide, HCTZ capsules or tablets What is this medicine? HYDROCHLOROTHIAZIDE (hye droe klor oh THYE a zide) is a diuretic. It increases the amount of urine passed, which causes the body to lose salt and water. This medicine is used to treat high blood pressure. It is also reduces the swelling and water retention caused by various medical conditions, such as  heart, liver, or kidney disease. This medicine may be used for other purposes; ask your health care provider or pharmacist if you have questions. COMMON BRAND NAME(S): Esidrix, Ezide, HydroDIURIL, Microzide, Oretic, Zide What should I tell my health care provider before I take this medicine? They need to know if you have any of these conditions: -diabetes -gout -immune system problems, like lupus -kidney disease or kidney stones -liver disease -pancreatitis -small amount of urine or difficulty passing urine -an unusual or allergic reaction to hydrochlorothiazide, sulfa drugs,  other medicines, foods, dyes, or preservatives -pregnant or trying to get pregnant -breast-feeding How should I use this medicine? Take this medicine by mouth with a glass of water. Follow the directions on the prescription label. Take your medicine at regular intervals. Remember that you will need to pass urine frequently after taking this medicine. Do not take your doses at a time of day that will cause you problems. Do not stop taking your medicine unless your doctor tells you to. Talk to your pediatrician regarding the use of this medicine in children. Special care may be needed. Overdosage: If you think you have taken too much of this medicine contact a poison control center or emergency room at once. NOTE: This medicine is only for you. Do not share this medicine with others. What if I miss a dose? If you miss a dose, take it as soon as you can. If it is almost time for your next dose, take only that dose. Do not take double or extra doses. What may interact with this medicine? -cholestyramine -colestipol -digoxin -dofetilide -lithium -medicines for blood pressure -medicines for diabetes -medicines that relax muscles for surgery -other diuretics -steroid medicines like prednisone or cortisone This list may not describe all possible interactions. Give your health care provider a list of all the medicines, herbs, non-prescription drugs, or dietary supplements you use. Also tell them if you smoke, drink alcohol, or use illegal drugs. Some items may interact with your medicine. What should I watch for while using this medicine? Visit your doctor or health care professional for regular checks on your progress. Check your blood pressure as directed. Ask your doctor or health care professional what your blood pressure should be and when you should contact him or her. You may need to be on a special diet while taking this medicine. Ask your doctor. Check with your doctor or health care  professional if you get an attack of severe diarrhea, nausea and vomiting, or if you sweat a lot. The loss of too much body fluid can make it dangerous for you to take this medicine. You may get drowsy or dizzy. Do not drive, use machinery, or do anything that needs mental alertness until you know how this medicine affects you. Do not stand or sit up quickly, especially if you are an older patient. This reduces the risk of dizzy or fainting spells. Alcohol may interfere with the effect of this medicine. Avoid alcoholic drinks. This medicine may affect your blood sugar level. If you have diabetes, check with your doctor or health care professional before changing the dose of your diabetic medicine. This medicine can make you more sensitive to the sun. Keep out of the sun. If you cannot avoid being in the sun, wear protective clothing and use sunscreen. Do not use sun lamps or tanning beds/booths. What side effects may I notice from receiving this medicine? Side effects that you should report to your doctor or health care professional as soon  as possible: -allergic reactions such as skin rash or itching, hives, swelling of the lips, mouth, tongue, or throat -changes in vision -chest pain -eye pain -fast or irregular heartbeat -feeling faint or lightheaded, falls -gout attack -muscle pain or cramps -pain or difficulty when passing urine -pain, tingling, numbness in the hands or feet -redness, blistering, peeling or loosening of the skin, including inside the mouth -unusually weak or tired Side effects that usually do not require medical attention (report to your doctor or health care professional if they continue or are bothersome): -change in sex drive or performance -dry mouth -headache -stomach upset This list may not describe all possible side effects. Call your doctor for medical advice about side effects. You may report side effects to FDA at 1-800-FDA-1088. Where should I keep my  medicine? Keep out of the reach of children. Store at room temperature between 15 and 30 degrees C (59 and 86 degrees F). Do not freeze. Protect from light and moisture. Keep container closed tightly. Throw away any unused medicine after the expiration date. NOTE: This sheet is a summary. It may not cover all possible information. If you have questions about this medicine, talk to your doctor, pharmacist, or health care provider.  2014, Elsevier/Gold Standard. (2010-06-08 12:57:37)  Lisinopril tablets What is this medicine? LISINOPRIL (lyse IN oh pril) is an ACE inhibitor. This medicine is used to treat high blood pressure and heart failure. It is also used to protect the heart immediately after a heart attack. This medicine may be used for other purposes; ask your health care provider or pharmacist if you have questions. COMMON BRAND NAME(S): Prinivil, Zestril What should I tell my health care provider before I take this medicine? They need to know if you have any of these conditions: -diabetes -heart or blood vessel disease -immune system disease like lupus or scleroderma -kidney disease -low blood pressure -previous swelling of the tongue, face, or lips with difficulty breathing, difficulty swallowing, hoarseness, or tightening of the throat -an unusual or allergic reaction to lisinopril, other ACE inhibitors, insect venom, foods, dyes, or preservatives -pregnant or trying to get pregnant -breast-feeding How should I use this medicine? Take this medicine by mouth with a glass of water. Follow the directions on your prescription label. You may take this medicine with or without food. Take your medicine at regular intervals. Do not stop taking this medicine except on the advice of your doctor or health care professional. Talk to your pediatrician regarding the use of this medicine in children. Special care may be needed. While this drug may be prescribed for children as young as 6 years of  age for selected conditions, precautions do apply. Overdosage: If you think you have taken too much of this medicine contact a poison control center or emergency room at once. NOTE: This medicine is only for you. Do not share this medicine with others. What if I miss a dose? If you miss a dose, take it as soon as you can. If it is almost time for your next dose, take only that dose. Do not take double or extra doses. What may interact with this medicine? -diuretics -lithium -NSAIDs, medicines for pain and inflammation, like ibuprofen or naproxen -over-the-counter herbal supplements like hawthorn -potassium salts or potassium supplements -salt substitutes This list may not describe all possible interactions. Give your health care provider a list of all the medicines, herbs, non-prescription drugs, or dietary supplements you use. Also tell them if you smoke, drink alcohol, or  use illegal drugs. Some items may interact with your medicine. What should I watch for while using this medicine? Visit your doctor or health care professional for regular check ups. Check your blood pressure as directed. Ask your doctor what your blood pressure should be, and when you should contact him or her. Call your doctor or health care professional if you notice an irregular or fast heart beat. Women should inform their doctor if they wish to become pregnant or think they might be pregnant. There is a potential for serious side effects to an unborn child. Talk to your health care professional or pharmacist for more information. Check with your doctor or health care professional if you get an attack of severe diarrhea, nausea and vomiting, or if you sweat a lot. The loss of too much body fluid can make it dangerous for you to take this medicine. You may get drowsy or dizzy. Do not drive, use machinery, or do anything that needs mental alertness until you know how this drug affects you. Do not stand or sit up quickly,  especially if you are an older patient. This reduces the risk of dizzy or fainting spells. Alcohol can make you more drowsy and dizzy. Avoid alcoholic drinks. Avoid salt substitutes unless you are told otherwise by your doctor or health care professional. Do not treat yourself for coughs, colds, or pain while you are taking this medicine without asking your doctor or health care professional for advice. Some ingredients may increase your blood pressure. What side effects may I notice from receiving this medicine? Side effects that you should report to your doctor or health care professional as soon as possible: -abdominal pain with or without nausea or vomiting -allergic reactions like skin rash or hives, swelling of the hands, feet, face, lips, throat, or tongue -dark urine -difficulty breathing -dizzy, lightheaded or fainting spell -fever or sore throat -irregular heart beat, chest pain -pain or difficulty passing urine -redness, blistering, peeling or loosening of the skin, including inside the mouth -unusually weak -yellowing of the eyes or skin Side effects that usually do not require medical attention (report to your doctor or health care professional if they continue or are bothersome): -change in taste -cough -decreased sexual function or desire -headache -sun sensitivity -tiredness This list may not describe all possible side effects. Call your doctor for medical advice about side effects. You may report side effects to FDA at 1-800-FDA-1088. Where should I keep my medicine? Keep out of the reach of children. Store at room temperature between 15 and 30 degrees C (59 and 86 degrees F). Protect from moisture. Keep container tightly closed. Throw away any unused medicine after the expiration date. NOTE: This sheet is a summary. It may not cover all possible information. If you have questions about this medicine, talk to your doctor, pharmacist, or health care provider.  2014,  Elsevier/Gold Standard. (2008-04-18 17:36:32)  DASH Diet The DASH diet stands for "Dietary Approaches to Stop Hypertension." It is a healthy eating plan that has been shown to reduce high blood pressure (hypertension) in as little as 14 days, while also possibly providing other significant health benefits. These other health benefits include reducing the risk of breast cancer after menopause and reducing the risk of type 2 diabetes, heart disease, colon cancer, and stroke. Health benefits also include weight loss and slowing kidney failure in patients with chronic kidney disease.  DIET GUIDELINES  Limit salt (sodium). Your diet should contain less than 1500 mg of sodium  daily.  Limit refined or processed carbohydrates. Your diet should include mostly whole grains. Desserts and added sugars should be used sparingly.  Include small amounts of heart-healthy fats. These types of fats include nuts, oils, and tub margarine. Limit saturated and trans fats. These fats have been shown to be harmful in the body. CHOOSING FOODS  The following food groups are based on a 2000 calorie diet. See your Registered Dietitian for individual calorie needs. Grains and Grain Products (6 to 8 servings daily)  Eat More Often: Whole-wheat bread, brown rice, whole-grain or wheat pasta, quinoa, popcorn without added fat or salt (air popped).  Eat Less Often: White bread, white pasta, white rice, cornbread. Vegetables (4 to 5 servings daily)  Eat More Often: Fresh, frozen, and canned vegetables. Vegetables may be raw, steamed, roasted, or grilled with a minimal amount of fat.  Eat Less Often/Avoid: Creamed or fried vegetables. Vegetables in a cheese sauce. Fruit (4 to 5 servings daily)  Eat More Often: All fresh, canned (in natural juice), or frozen fruits. Dried fruits without added sugar. One hundred percent fruit juice ( cup [237 mL] daily).  Eat Less Often: Dried fruits with added sugar. Canned fruit in light or  heavy syrup. [employer], Fish, and Poultry (2 servings or less daily. One serving is 3 to 4 oz [85-114 g]).  Eat More Often: Ninety percent or leaner ground beef, tenderloin, sirloin. Round cuts of beef, chicken breast, [place] breast. All fish. Grill, bake, or broil your meat. Nothing should be fried.  Eat Less Often/Avoid: Fatty cuts of meat, [place], or chicken leg, thigh, or wing. Fried cuts of meat or fish. Dairy (2 to 3 servings)  Eat More Often: Low-fat or fat-free milk, low-fat plain or light yogurt, reduced-fat or part-skim cheese.  Eat Less Often/Avoid: Milk (whole, 2%).Whole milk yogurt. Full-fat cheeses. Nuts, Seeds, and Legumes (4 to 5 servings per week)  Eat More Often: All without added salt.  Eat Less Often/Avoid: Salted nuts and seeds, canned beans with added salt. Fats and Sweets (limited)  Eat More Often: Vegetable oils, tub margarines without trans fats, sugar-free gelatin. Mayonnaise and salad dressings.  Eat Less Often/Avoid: Coconut oils, palm oils, butter, stick margarine, cream, half and half, cookies, candy, pie. FOR MORE INFORMATION The Dash Diet Eating Plan: www.dashdiet.org Document Released: 10/03/2011 Document Revised: 01/06/2012 Document Reviewed: 10/03/2011 [facility] Patient Information 2014 [place], [place].  Hypertension As your heart beats, it forces blood through your arteries. This force is your blood pressure. If the pressure is too high, it is called hypertension (HTN) or high blood pressure. HTN is dangerous because you may have it and not know it. High blood pressure may mean that your heart has to work harder to pump blood. Your arteries may be narrow or stiff. The extra work puts you at risk for heart disease, stroke, and other problems.  Blood pressure consists of two numbers, a higher number over a lower, 110/72, for example. It is stated as "110 over 72." The ideal is below 120 for the top number (systolic) and under 80 for the bottom  (diastolic). Write down your blood pressure today. You should pay close attention to your blood pressure if you have certain conditions such as:  Heart failure.  Prior heart attack.  Diabetes  Chronic kidney disease.  Prior stroke.  Multiple risk factors for heart disease. To see if you have HTN, your blood pressure should be measured while you are seated with your arm held at the level  of the heart. It should be measured at least twice. A one-time elevated blood pressure reading (especially in the Emergency Department) does not mean that you need treatment. There may be conditions in which the blood pressure is different between your right and left arms. It is important to see your caregiver soon for a recheck. Most people have essential hypertension which means that there is not a specific cause. This type of high blood pressure may be lowered by changing lifestyle factors such as:  Stress.  Smoking.  Lack of exercise.  Excessive weight.  Drug/tobacco/alcohol use.  Eating less salt. Most people do not have symptoms from high blood pressure until it has caused damage to the body. Effective treatment can often prevent, delay or reduce that damage. TREATMENT  When a cause has been identified, treatment for high blood pressure is directed at the cause. There are a large number of medications to treat HTN. These fall into several categories, and your caregiver will help you select the medicines that are best for you. Medications may have side effects. You should review side effects with your caregiver. If your blood pressure stays high after you have made lifestyle changes or started on medicines,   Your medication(s) may need to be changed.  Other problems may need to be addressed.  Be certain you understand your prescriptions, and know how and when to take your medicine.  Be sure to follow up with your caregiver within the time frame advised (usually within two weeks) to have  your blood pressure rechecked and to review your medications.  If you are taking more than one medicine to lower your blood pressure, make sure you know how and at what times they should be taken. Taking two medicines at the same time can result in blood pressure that is too low. SEEK IMMEDIATE MEDICAL CARE IF:  You develop a severe headache, blurred or changing vision, or confusion.  You have unusual weakness or numbness, or a faint feeling.  You have severe chest or abdominal pain, vomiting, or breathing problems. MAKE SURE YOU:   Understand these instructions.  Will watch your condition.  Will get help right away if you are not doing well or get worse. Document Released: 10/14/2005 Document Revised: 01/06/2012 Document Reviewed: 06/03/2008 [facility] Patient Information 2014 [place], [place]. Lisinopril tablets What is this medicine? LISINOPRIL (lyse IN oh pril) is an ACE inhibitor. This medicine is used to treat high blood pressure and heart failure. It is also used to protect the heart immediately after a heart attack. This medicine may be used for other purposes; ask your health care provider or pharmacist if you have questions. COMMON BRAND NAME(S): Prinivil, Zestril What should I tell my health care provider before I take this medicine? They need to know if you have any of these conditions: -diabetes -heart or blood vessel disease -immune system disease like lupus or scleroderma -kidney disease -low blood pressure -previous swelling of the tongue, face, or lips with difficulty breathing, difficulty swallowing, hoarseness, or tightening of the throat -an unusual or allergic reaction to lisinopril, other ACE inhibitors, insect venom, foods, dyes, or preservatives -pregnant or trying to get pregnant -breast-feeding How should I use this medicine? Take this medicine by mouth with a glass of water. Follow the directions on your prescription label. You may take this medicine with  or without food. Take your medicine at regular intervals. Do not stop taking this medicine except on the advice of your doctor or health care professional.  Talk to your pediatrician regarding the use of this medicine in children. Special care may be needed. While this drug may be prescribed for children as young as 59 years of age for selected conditions, precautions do apply. Overdosage: If you think you have taken too much of this medicine contact a poison control center or emergency room at once. NOTE: This medicine is only for you. Do not share this medicine with others. What if I miss a dose? If you miss a dose, take it as soon as you can. If it is almost time for your next dose, take only that dose. Do not take double or extra doses. What may interact with this medicine? -diuretics -lithium -NSAIDs, medicines for pain and inflammation, like ibuprofen or naproxen -over-the-counter herbal supplements like hawthorn -potassium salts or potassium supplements -salt substitutes This list may not describe all possible interactions. Give your health care provider a list of all the medicines, herbs, non-prescription drugs, or dietary supplements you use. Also tell them if you smoke, drink alcohol, or use illegal drugs. Some items may interact with your medicine. What should I watch for while using this medicine? Visit your doctor or health care professional for regular check ups. Check your blood pressure as directed. Ask your doctor what your blood pressure should be, and when you should contact him or her. Call your doctor or health care professional if you notice an irregular or fast heart beat. Women should inform their doctor if they wish to become pregnant or think they might be pregnant. There is a potential for serious side effects to an unborn child. Talk to your health care professional or pharmacist for more information. Check with your doctor or health care professional if you get an attack  of severe diarrhea, nausea and vomiting, or if you sweat a lot. The loss of too much body fluid can make it dangerous for you to take this medicine. You may get drowsy or dizzy. Do not drive, use machinery, or do anything that needs mental alertness until you know how this drug affects you. Do not stand or sit up quickly, especially if you are an older patient. This reduces the risk of dizzy or fainting spells. Alcohol can make you more drowsy and dizzy. Avoid alcoholic drinks. Avoid salt substitutes unless you are told otherwise by your doctor or health care professional. Do not treat yourself for coughs, colds, or pain while you are taking this medicine without asking your doctor or health care professional for advice. Some ingredients may increase your blood pressure. What side effects may I notice from receiving this medicine? Side effects that you should report to your doctor or health care professional as soon as possible: -abdominal pain with or without nausea or vomiting -allergic reactions like skin rash or hives, swelling of the hands, feet, face, lips, throat, or tongue -dark urine -difficulty breathing -dizzy, lightheaded or fainting spell -fever or sore throat -irregular heart beat, chest pain -pain or difficulty passing urine -redness, blistering, peeling or loosening of the skin, including inside the mouth -unusually weak -yellowing of the eyes or skin Side effects that usually do not require medical attention (report to your doctor or health care professional if they continue or are bothersome): -change in taste -cough -decreased sexual function or desire -headache -sun sensitivity -tiredness This list may not describe all possible side effects. Call your doctor for medical advice about side effects. You may report side effects to FDA at 1-800-FDA-1088. Where should I keep my  medicine? Keep out of the reach of children. Store at room temperature between 15 and 30 degrees C  (59 and 86 degrees F). Protect from moisture. Keep container tightly closed. Throw away any unused medicine after the expiration date. NOTE: This sheet is a summary. It may not cover all possible information. If you have questions about this medicine, talk to your doctor, pharmacist, or health care provider.  2014, Elsevier/Gold Standard. (2008-04-18 17:36:32)

## 2014-02-16 NOTE — Progress Notes (Signed)
INTERNAL MEDICINE TEACHING ATTENDING ADDENDUM - Clodagh Odenthal, MD: I reviewed and discussed at the time of visit with the resident Dr. McLean, the patient's medical history, physical examination, diagnosis and results of tests and treatment and I agree with the patient's care as documented. 

## 2014-02-16 NOTE — Progress Notes (Signed)
   Subjective:    Patient ID: Courtney Drake, female    DOB: [birth year], 48 y.o.   MRN: [id number]  HPI Comments: 48 y.o. Past Medical History Ectopic pregnancy, Hypertension (BP 198/98 then 206/111), Menorrhagia, Claustrophobia.  She presents for BP f/u  1)HTN uncontrolled today. She was unable to get Coreg refilled but took Lisinopril 10 mg this am. She was d/c'ed on Coreg 12/2013 when ACS was presumed but cardiac w/u was negative.  She feels well.                                                    Review of Systems  Respiratory: Negative for shortness of breath.   Cardiovascular: Negative for chest pain.  Gastrointestinal: Negative for abdominal pain.       Objective:   Physical Exam  Nursing note and vitals reviewed. Constitutional: She is oriented to person, place, and time. She appears well-developed and well-nourished. She is cooperative. No distress.  HENT:  Head: Normocephalic and atraumatic.  Mouth/Throat: Oropharynx is clear and moist and mucous membranes are normal. No oropharyngeal exudate.  Eyes: Conjunctivae are normal. Pupils are equal, round, and reactive to light. Right eye exhibits no discharge. Left eye exhibits no discharge. No scleral icterus.  Cardiovascular: Normal rate, regular rhythm, S1 normal, S2 normal and normal heart sounds.   No murmur heard. Trace lower ext edema   Pulmonary/Chest: Effort normal and breath sounds normal. No respiratory distress. She has no wheezes.  Abdominal: Soft. Bowel sounds are normal. There is no tenderness.  obese  Neurological: She is alert and oriented to person, place, and time. Gait normal.  Skin: Skin is warm, dry and intact. No rash noted. She is not diaphoretic.  Psychiatric: She has a normal mood and affect. Her speech is normal and behavior is normal. Judgment and thought content normal. Cognition and memory are normal.          Assessment & Plan:  F/u in 1-2 weeks HTN

## 2014-04-20 ENCOUNTER — Ambulatory Visit: Payer: BC Managed Care – PPO | Admitting: Internal Medicine

## 2014-04-21 ENCOUNTER — Encounter: Payer: Self-pay | Admitting: Pharmacist

## 2014-04-22 NOTE — Telephone Encounter (Signed)
error 

## 2014-04-28 ENCOUNTER — Encounter: Payer: Self-pay | Admitting: Internal Medicine

## 2014-04-28 ENCOUNTER — Ambulatory Visit (INDEPENDENT_AMBULATORY_CARE_PROVIDER_SITE_OTHER): Payer: BC Managed Care – PPO | Admitting: Internal Medicine

## 2014-04-28 VITALS — BP 139/77 | HR 79 | Temp 97.9°F | Ht 63.0 in | Wt 202.7 lb

## 2014-04-28 DIAGNOSIS — Z889 Allergy status to unspecified drugs, medicaments and biological substances status: Secondary | ICD-10-CM

## 2014-04-28 DIAGNOSIS — Z9109 Other allergy status, other than to drugs and biological substances: Secondary | ICD-10-CM

## 2014-04-28 DIAGNOSIS — N920 Excessive and frequent menstruation with regular cycle: Secondary | ICD-10-CM

## 2014-04-28 DIAGNOSIS — I10 Essential (primary) hypertension: Secondary | ICD-10-CM

## 2014-04-28 HISTORY — DX: Allergy status to unspecified drugs, medicaments and biological substances: Z88.9

## 2014-04-28 MED ORDER — LISINOPRIL-HYDROCHLOROTHIAZIDE 20-12.5 MG PO TABS: 1.0000 | ORAL_TABLET | Freq: Every day | ORAL | Status: DC

## 2014-04-28 NOTE — Assessment & Plan Note (Signed)
Likely causing cough, nasal drainage disc'ed OTC antihistamines and nasal saline prn

## 2014-04-28 NOTE — Assessment & Plan Note (Addendum)
BP Readings from Last 3 Encounters:  04/28/14 139/77  02/16/14 206/111  02/04/14 140/80    Lab Results  Component Value Date   NA 139 02/04/2014   K 3.9 02/04/2014   CREATININE 0.74 02/04/2014    Assessment: Blood pressure control: controlled Progress toward BP goal:  at goal Comments: more controlled since last visit   Plan: Medications:  continue current medications (Lisinopril 20-HCTZ 12.5 mg qd)  Educational resources provided: other (see comments) Self management tools provided: other (see comments) Other plans: f/u in 3-6 months, BMET 02/04/14 wnl. Will get one prior to next visit

## 2014-04-28 NOTE — Assessment & Plan Note (Signed)
OB/GYN wants pt to take Fe supplementation which she will resume with prn bowel regimen

## 2014-04-28 NOTE — Patient Instructions (Addendum)
General Instructions: Please follow up in 3-6 months, sooner if needed   Treatment Goals:  Goals (1 Years of Data) as of 04/28/14         As of Today 02/16/14 02/16/14 02/04/14 02/04/14     Blood Pressure    . Blood Pressure < 140/90  139/77 206/111 198/98 140/80 165/84      Progress Toward Treatment Goals:  Treatment Goal 04/28/2014  Blood pressure at goal    Self Care Goals & Plans:  Self Care Goal 04/28/2014  Manage my medications take my medicines as prescribed; bring my medications to every visit; refill my medications on time  Monitor my health keep track of my blood pressure  Eat healthy foods eat more vegetables; eat foods that are low in salt; eat baked foods instead of fried foods; eat smaller portions; drink diet soda or water instead of juice or soda; eat fruit for snacks and desserts  Be physically active find an activity I enjoy; take a walk every day  Meeting treatment goals maintain the current self-care plan    No flowsheet data found.   Care Management & Community Referrals:  Referral 04/28/2014  Referrals made for care management support none needed  Referrals made to community resources none       Exercise to Lose Weight Exercise and a healthy diet may help you lose weight. Your doctor may suggest specific exercises. EXERCISE IDEAS AND TIPS  Choose low-cost things you enjoy doing, such as walking, bicycling, or exercising to workout videos.  Take stairs instead of the elevator.  Walk during your lunch break.  Park your car further away from work or school.  Go to a gym or an exercise class.  Start with 5 to 10 minutes of exercise each day. Build up to 30 minutes of exercise 4 to 6 days a week.  Wear shoes with good support and comfortable clothes.  Stretch before and after working out.  Work out until you breathe harder and your heart beats faster.  Drink extra water when you exercise.  Do not do so much that you hurt yourself, feel dizzy, or  get very short of breath. Exercises that burn about 150 calories:  Running 1  miles in 15 minutes.  Playing volleyball for 45 to 60 minutes.  Washing and waxing a car for 45 to 60 minutes.  Playing touch football for 45 minutes.  Walking 1  miles in 35 minutes.  Pushing a stroller 1  miles in 30 minutes.  Playing basketball for 30 minutes.  Raking leaves for 30 minutes.  Bicycling 5 miles in 30 minutes.  Walking 2 miles in 30 minutes.  Dancing for 30 minutes.  Shoveling snow for 15 minutes.  Swimming laps for 20 minutes.  Walking up stairs for 15 minutes.  Bicycling 4 miles in 15 minutes.  Gardening for 30 to 45 minutes.  Jumping rope for 15 minutes.  Washing windows or floors for 45 to 60 minutes. Document Released: 11/16/2010 Document Revised: 01/06/2012 Document Reviewed: 11/16/2010 [facility] Patient Information 2015 [place], [place]. This information is not intended to replace advice given to you by your health care provider. Make sure you discuss any questions you have with your health care provider.  Hypertension Hypertension, commonly called high blood pressure, is when the force of blood pumping through your arteries is too strong. Your arteries are the blood vessels that carry blood from your heart throughout your body. A blood pressure reading consists of a higher number over  a lower number, such as 110/72. The higher number (systolic) is the pressure inside your arteries when your heart pumps. The lower number (diastolic) is the pressure inside your arteries when your heart relaxes. Ideally you want your blood pressure below 120/80. Hypertension forces your heart to work harder to pump blood. Your arteries may become narrow or stiff. Having hypertension puts you at risk for heart disease, stroke, and other problems.  RISK FACTORS Some risk factors for high blood pressure are controllable. Others are not.  Risk factors you cannot control include:   Race. You  may be at higher risk if you are African American.  Age. Risk increases with age.  Gender. Men are at higher risk than women before age 31 years. After age 31, women are at higher risk than men. Risk factors you can control include:  Not getting enough exercise or physical activity.  Being overweight.  Getting too much fat, sugar, calories, or salt in your diet.  Drinking too much alcohol. SIGNS AND SYMPTOMS Hypertension does not usually cause signs or symptoms. Extremely high blood pressure (hypertensive crisis) may cause headache, anxiety, shortness of breath, and nosebleed. DIAGNOSIS  To check if you have hypertension, your health care provider will measure your blood pressure while you are seated, with your arm held at the level of your heart. It should be measured at least twice using the same arm. Certain conditions can cause a difference in blood pressure between your right and left arms. A blood pressure reading that is higher than normal on one occasion does not mean that you need treatment. If one blood pressure reading is high, ask your health care provider about having it checked again. TREATMENT  Treating high blood pressure includes making lifestyle changes and possibly taking medication. Living a healthy lifestyle can help lower high blood pressure. You may need to change some of your habits. Lifestyle changes may include:  Following the DASH diet. This diet is high in fruits, vegetables, and whole grains. It is low in salt, red meat, and added sugars.  Getting at least 2 1/2 hours of brisk physical activity every week.  Losing weight if necessary.  Not smoking.  Limiting alcoholic beverages.  Learning ways to reduce stress. If lifestyle changes are not enough to get your blood pressure under control, your health care provider may prescribe medicine. You may need to take more than one. Work closely with your health care provider to understand the risks and  benefits. HOME CARE INSTRUCTIONS  Have your blood pressure rechecked as directed by your health care provider.   Only take medicine as directed by your health care provider. Follow the directions carefully. Blood pressure medicines must be taken as prescribed. The medicine does not work as well when you skip doses. Skipping doses also puts you at risk for problems.   Do not smoke.   Monitor your blood pressure at home as directed by your health care provider. SEEK MEDICAL CARE IF:   You think you are having a reaction to medicines taken.  You have recurrent headaches or feel dizzy.  You have swelling in your ankles.  You have trouble with your vision. SEEK IMMEDIATE MEDICAL CARE IF:  You develop a severe headache or confusion.  You have unusual weakness, numbness, or feel faint.  You have severe chest or abdominal pain.  You vomit repeatedly.  You have trouble breathing. MAKE SURE YOU:   Understand these instructions.  Will watch your condition.  Will get help  right away if you are not doing well or get worse. Document Released: 10/14/2005 Document Revised: 10/19/2013 Document Reviewed: 08/06/2013 [facility]Exit[facility] Patient Information 2015 [place], [place]. This information is not intended to replace advice given to you by your health care provider. Make sure you discuss any questions you have with your health care provider.

## 2014-04-28 NOTE — Progress Notes (Signed)
Case discussed with Dr. McLean soon after the resident saw the patient.  We reviewed the resident's history and exam and pertinent patient test results.  I agree with the assessment, diagnosis, and plan of care documented in the resident's note. 

## 2014-04-28 NOTE — Progress Notes (Signed)
   Subjective:    Patient ID: Courtney Drake, female    DOB: April 05, 1966, 48 y.o.   MRN: [id number]  HPI Comments: 48 y.o. Past Medical History Ectopic pregnancy, Hypertension (BP 139/77 ), Menorrhagia, Claustrophobia.   She presents for f/u: 1)HTN more controlled today with ACEI-HCTZ combo                                      2) She c/o cough and nasal drainage noticed with exposure to dust, pollen, cough sometimes has white phlegm and is not dry. Denies itchy, watery eyes, nasal congestion.  She has a h/o allergies a child.      3) Obesity-discused wt loss and exercise due to prediabetes HA1C 6.0. Pt set weight loss goal for now of 198 lbs  SH: 2 kids young daughter and son in the army.       Review of Systems  Eyes: Negative for itching.  Respiratory: Negative for shortness of breath.   Cardiovascular: Negative for chest pain and leg swelling.  Gastrointestinal: Negative for constipation.       Objective:   Physical Exam  Nursing note and vitals reviewed. Constitutional: She is oriented to person, place, and time. Vital signs are normal. She appears well-developed and well-nourished. She is cooperative. No distress.  HENT:  Head: Normocephalic and atraumatic.  Mouth/Throat: Oropharynx is clear and moist and mucous membranes are normal. No oropharyngeal exudate.  Eyes: Conjunctivae are normal. Right eye exhibits no discharge. Left eye exhibits no discharge. No scleral icterus.  Cardiovascular: Normal rate, regular rhythm, S1 normal, S2 normal and normal heart sounds.   No murmur heard. No lower ext edema   Pulmonary/Chest: Effort normal and breath sounds normal. No respiratory distress. She has no wheezes.  Abdominal: Soft. Bowel sounds are normal. There is no tenderness.  Obese ab  Neurological: She is alert and oriented to person, place, and time. Gait normal.  Skin: Skin is warm, dry and intact. No rash noted. She is not diaphoretic.  Psychiatric: She has a normal mood  and affect. Her speech is normal and behavior is normal. Judgment and thought content normal. Cognition and memory are normal.          Assessment & Plan:  F/u in 3-6 months

## 2014-05-02 ENCOUNTER — Telehealth: Payer: Self-pay | Admitting: *Deleted

## 2014-05-02 ENCOUNTER — Encounter: Payer: Self-pay | Admitting: Internal Medicine

## 2014-05-02 ENCOUNTER — Other Ambulatory Visit (INDEPENDENT_AMBULATORY_CARE_PROVIDER_SITE_OTHER): Payer: BC Managed Care – PPO

## 2014-05-02 ENCOUNTER — Telehealth: Payer: Self-pay | Admitting: Internal Medicine

## 2014-05-02 ENCOUNTER — Other Ambulatory Visit: Payer: Self-pay | Admitting: Internal Medicine

## 2014-05-02 DIAGNOSIS — I10 Essential (primary) hypertension: Secondary | ICD-10-CM

## 2014-05-02 LAB — BASIC METABOLIC PANEL WITH GFR
BUN: 9 mg/dL (ref 6–23)
CHLORIDE: 101 meq/L (ref 96–112)
CO2: 29 meq/L (ref 19–32)
Calcium: 9.2 mg/dL (ref 8.4–10.5)
Creat: 0.67 mg/dL (ref 0.50–1.10)
GFR, Est African American: >89 mL/min
GFR, Est Non African American: >89 mL/min
Glucose, Bld: 77 mg/dL (ref 70–99)
Potassium: 4.1 mEq/L (ref 3.5–5.3)
Sodium: 137 mEq/L (ref 135–145)

## 2014-05-02 MED ORDER — HYDROCHLOROTHIAZIDE 12.5 MG PO CAPS: 12.5000 mg | ORAL_CAPSULE | Freq: Every day | ORAL | Status: DC

## 2014-05-02 MED ORDER — LOSARTAN POTASSIUM 25 MG PO TABS: 25.0000 mg | ORAL_TABLET | Freq: Every day | ORAL | Status: DC

## 2014-05-02 NOTE — Telephone Encounter (Signed)
Pt here states her new medication is causing her to have a dry cough.  Wanted to make sure that Dr. Shirlee LatchMcLean will be informed.  Message sent to Dr. Shirlee LatchMcLean.  Angelina OkGladys Herbin, RN 05/02/2014 11:41 AM

## 2014-05-02 NOTE — Telephone Encounter (Signed)
Will d/c Lisinopril 20mg  daily as pt noticed cough.  Will change to Losartan 25 mg and keep HCTZ dose the same.    Shirlee LatchMcLean MD

## 2014-06-07 ENCOUNTER — Other Ambulatory Visit: Payer: Self-pay | Admitting: *Deleted

## 2014-06-07 NOTE — Telephone Encounter (Signed)
90 day supplies please

## 2014-06-08 MED ORDER — LOSARTAN POTASSIUM 25 MG PO TABS: 25.0000 mg | ORAL_TABLET | Freq: Every day | ORAL | Status: DC

## 2014-06-08 MED ORDER — HYDROCHLOROTHIAZIDE 12.5 MG PO CAPS: 12.5000 mg | ORAL_CAPSULE | Freq: Every day | ORAL | Status: DC

## 2014-08-29 ENCOUNTER — Encounter: Payer: Self-pay | Admitting: Internal Medicine

## 2014-09-05 ENCOUNTER — Encounter: Payer: Self-pay | Admitting: Internal Medicine

## 2014-09-05 DIAGNOSIS — Z0000 Encounter for general adult medical examination without abnormal findings: Secondary | ICD-10-CM | POA: Insufficient documentation

## 2015-01-24 ENCOUNTER — Encounter: Payer: Self-pay | Admitting: Internal Medicine

## 2015-03-15 ENCOUNTER — Encounter: Payer: Self-pay | Admitting: *Deleted

## 2015-06-22 ENCOUNTER — Other Ambulatory Visit: Payer: Self-pay | Admitting: Internal Medicine

## 2015-06-22 NOTE — Telephone Encounter (Signed)
[facility] [address]. Pt needs both meds listed

## 2015-06-26 MED ORDER — LOSARTAN POTASSIUM 25 MG PO TABS: 25.0000 mg | ORAL_TABLET | Freq: Every day | ORAL | Status: DC

## 2015-06-26 MED ORDER — HYDROCHLOROTHIAZIDE 12.5 MG PO CAPS: 12.5000 mg | ORAL_CAPSULE | Freq: Every day | ORAL | Status: DC

## 2015-06-26 NOTE — Telephone Encounter (Signed)
Pt called requesting hydrochlorothiazide and losartan to be filled.

## 2015-09-14 ENCOUNTER — Encounter: Payer: Self-pay | Admitting: Internal Medicine

## 2016-08-07 ENCOUNTER — Other Ambulatory Visit: Payer: Self-pay | Admitting: Internal Medicine

## 2016-08-12 ENCOUNTER — Telehealth: Payer: Self-pay

## 2016-08-12 NOTE — Telephone Encounter (Signed)
APT. REMINDER CALL, NO ANSWER, NO VOICEMAIL °

## 2016-08-13 ENCOUNTER — Encounter: Payer: Self-pay | Admitting: Internal Medicine

## 2016-08-13 ENCOUNTER — Ambulatory Visit (INDEPENDENT_AMBULATORY_CARE_PROVIDER_SITE_OTHER): Payer: Self-pay | Admitting: Internal Medicine

## 2016-08-13 VITALS — BP 153/86 | HR 69 | Temp 98.0°F | Ht 63.0 in | Wt 206.2 lb

## 2016-08-13 DIAGNOSIS — Z0000 Encounter for general adult medical examination without abnormal findings: Secondary | ICD-10-CM

## 2016-08-13 DIAGNOSIS — Z79899 Other long term (current) drug therapy: Secondary | ICD-10-CM

## 2016-08-13 DIAGNOSIS — I10 Essential (primary) hypertension: Secondary | ICD-10-CM

## 2016-08-13 NOTE — Progress Notes (Signed)
   CC: HTN  HPI:  Courtney Drake is a 50 y.o. female with PMH of HTN who presents for follow up management of her HTN.  Patient last seen by us in 2015. She has been taking Losartan 25 mg daily and HCTZ 12.5 mg daily for her blood pressure. She ran out of her medications last week and is requesting refills today. Last BMP in July 2015 was normal. Blood pressure today is 153/86. She was previously on Lisinopril which was switched due to cough. She denise any chest pain, SOB, palpitations, HA, change in vision, N/V/D, abdominal pain, myalgias, numbness, tingling, or diaphoresis.  Patient reports having the flu shot this year. She reports a history of breast cancer in her father and she says she had a normal mammogram this year.   Past Medical History:  Diagnosis Date  . Claustrophobia   . Ectopic pregnancy affecting fetus or newborn   . History of chicken pox   . Hypertension   . Menorrhagia    Family History  Problem Relation Age of Onset  . Breast cancer Father   . Heart attack Mother [id number]  . Diabetes Mother   . Hypertension Mother   . Stroke Mother   . Kidney failure Mother   . Asthma Daughter   . Kidney failure Maternal Uncle    Social History   Social History  . Marital status: Married    Spouse name: N/A  . Number of children: N/A  . Years of education: N/A   Social History Main Topics  . Smoking status: Never Smoker  . Smokeless tobacco: Never Used  . Alcohol use No  . Drug use: No  . Sexual activity: Yes    Birth control/ protection: None   Other Topics Concern  . 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  Does not have a PCP.    Review of Systems:   Review of Systems  Constitutional: Negative for chills, diaphoresis and fever.  Eyes:       No change in vision  Respiratory: Negative for sputum production, shortness of breath and wheezing.   Cardiovascular: Negative for chest pain, palpitations and leg swelling.    Gastrointestinal: Negative for abdominal pain, blood in stool, constipation, diarrhea, melena, nausea and vomiting.  Genitourinary: Negative for dysuria, frequency and hematuria.  Musculoskeletal: Negative for myalgias.  Neurological: Negative for dizziness, tingling, sensory change, focal weakness, loss of consciousness and headaches.  Psychiatric/Behavioral: Negative for substance abuse.     Physical Exam:  Vitals:   08/13/16 1606  BP: (!) 153/86  Pulse: 69  Temp: 98 F (36.7 C)  TempSrc: Oral  SpO2: 100%  Weight: 206 lb 3.2 oz (93.5 kg)  Height: 5\' 3"  (1.6 m)   Physical Exam  Constitutional: She appears well-developed and well-nourished. No distress.  HENT:  Head: Normocephalic and atraumatic.  Cardiovascular: Normal rate and regular rhythm.   No murmur heard. Pulmonary/Chest: Effort normal. No respiratory distress. She has no wheezes. She has no rales.  Musculoskeletal: Normal range of motion. She exhibits no edema or tenderness.  Neurological: She is alert.  Skin: Skin is warm. She is not diaphoretic.  Psychiatric: She has a normal mood and affect.    Assessment & Plan:   See Encounters Tab for problem based charting.  Patient discussed with Dr. Oswaldo DoneVincent

## 2016-08-13 NOTE — Assessment & Plan Note (Signed)
Patient last seen by us in 2015. She has been taking Losartan 25 mg daily and HCTZ 12.5 mg daily for her blood pressure. She ran out of her medications last week and is requesting refills today. Last BMP in July 2015 was normal. Blood pressure today is 153/86. She was previously on Lisinopril which was switched due to cough. She denise any chest pain, SOB, palpitations, HA, change in vision, N/V/D, abdominal pain, myalgias, numbness, tingling, or diaphoresis.  Blood pressure slightly elevated today while she has been off her medications for at least 4 days. We will check a BMET today and, if normal K and renal function, will refill her Losartan and HCTZ. -f/u BMET -Refill Losartan 25 mg daily and HCTZ 12.5 mg daily if BMP ok -f/u in 6-12 months for routine health maintenance and BP check

## 2016-08-13 NOTE — Assessment & Plan Note (Signed)
Patient reports having the flu shot this year. She reports a history of breast cancer in her father and she says she had a normal mammogram this year.

## 2016-08-13 NOTE — Patient Instructions (Signed)
It was a pleasure to meet you [name].  We will check lab work today to make sure it is okay to resume your blood pressure medications. We would like to check this blood work at least once a year.  If the lab work is okay, I will refill your Losartan and HCTZ for your blood pressure.  Please follow up with us in 6-12 months or sooner if needed.   Hydrochlorothiazide, HCTZ; Losartan tablets What is this medicine? LOSARTAN; HYDROCHLOROTHIAZIDE (loe SAR tan; hye droe klor oh THYE a zide) is a combination of a drug that relaxes blood vessels and a diuretic. It is used to treat high blood pressure. This medicine may also reduce the risk of stroke in certain patients. This medicine may be used for other purposes; ask your health care provider or pharmacist if you have questions. What should I tell my health care provider before I take this medicine? They need to know if you have any of these conditions: -decreased urine -kidney disease -liver disease -if you are on a special diet, like a low-salt diet -immune system problems, like lupus -an unusual or allergic reaction to losartan, hydrochlorothiazide, sulfa drugs, other medicines, foods, dyes, or preservatives -pregnant or trying to get pregnant -breast-feeding How should I use this medicine? Take this medicine by mouth with a glass of water. Follow the directions on the prescription label. You can take it with or without food. If it upsets your stomach, take it with food. Take your medicine at regular intervals. Do not take it more often than directed. Do not stop taking except on your doctor's advice. Talk to your pediatrician regarding the use of this medicine in children. Special care may be needed. Overdosage: If you think you have taken too much of this medicine contact a poison control center or emergency room at once. NOTE: This medicine is only for you. Do not share this medicine with others. What if I miss a dose? If you miss a  dose, take it as soon as you can. If it is almost time for your next dose, take only that dose. Do not take double or extra doses. What may interact with this medicine? -barbiturates, like phenobarbital -blood pressure medicines -celecoxib -cimetidine -corticosteroids -diabetic medicines -diuretics, especially triamterene, spironolactone or amiloride -fluconazole -lithium -NSAIDs, medicines for pain and inflammation, like ibuprofen or naproxen -potassium salts or potassium supplements -prescription pain medicines -rifampin -skeletal muscle relaxants like tubocurarine -some cholesterol-lowering medicines like cholestyramine or colestipol This list may not describe all possible interactions. Give your health care provider a list of all the medicines, herbs, non-prescription drugs, or dietary supplements you use. Also tell them if you smoke, drink alcohol, or use illegal drugs. Some items may interact with your medicine. What should I watch for while using this medicine? Check your blood pressure regularly while you are taking this medicine. Ask your doctor or health care professional what your blood pressure should be and when you should contact him or her. When you check your blood pressure, write down the measurements to show your doctor or health care professional. If you are taking this medicine for a long time, you must visit your health care professional for regular checks on your progress. Make sure you schedule appointments on a regular basis. You must not get dehydrated. Ask your doctor or health care professional how much fluid you need to drink a day. Check with him or her if you get an attack of severe diarrhea, nausea and vomiting,  or if you sweat a lot. The loss of too much body fluid can make it dangerous for you to take this medicine. Women should inform their doctor if they wish to become pregnant or think they might be pregnant. There is a potential for serious side effects to an  unborn child, particularly in the second or third trimester. Talk to your health care professional or pharmacist for more information. You may get drowsy or dizzy. Do not drive, use machinery, or do anything that needs mental alertness until you know how this drug affects you. Do not stand or sit up quickly, especially if you are an older patient. This reduces the risk of dizzy or fainting spells. Alcohol can make you more drowsy and dizzy. Avoid alcoholic drinks. This medicine may affect your blood sugar level. If you have diabetes, check with your doctor or health care professional before changing the dose of your diabetic medicine. Avoid salt substitutes unless you are told otherwise by your doctor or health care professional. Do not treat yourself for coughs, colds, or pain while you are taking this medicine without asking your doctor or health care professional for advice. Some ingredients may increase your blood pressure. What side effects may I notice from receiving this medicine? Side effects that you should report to your doctor or health care professional as soon as possible: -allergic reactions like skin rash, itching or hives, swelling of the face, lips, or tongue -breathing problems -changes in vision -dark urine -eye pain -fast or irregular heart beat, palpitations, or chest pain -feeling faint or lightheaded -muscle cramps -persistent dry cough -redness, blistering, peeling or loosening of the skin, including inside the mouth -stomach pain -trouble passing urine or change in the amount of urine -unusual bleeding or bruising -worsened gout pain -yellowing of the eyes or skin Side effects that usually do not require medical attention (report to your doctor or health care professional if they continue or are bothersome): -change in sex drive or performance -headache This list may not describe all possible side effects. Call your doctor for medical advice about side effects. You  may report side effects to FDA at 1-800-FDA-1088. Where should I keep my medicine? Keep out of the reach of children. Store at room temperature between 15 and 30 degrees C (59 and 86 degrees F). Protect from light. Keep container tightly closed. Throw away any unused medicine after the expiration date. NOTE: This sheet is a summary. It may not cover all possible information. If you have questions about this medicine, talk to your doctor, pharmacist, or health care provider.    2016, [employer]. (2010-07-04 13:57:32)

## 2016-08-14 ENCOUNTER — Other Ambulatory Visit: Payer: Self-pay | Admitting: Internal Medicine

## 2016-08-14 LAB — BMP8+ANION GAP
ANION GAP: 13 mmol/L (ref 10.0–18.0)
BUN/Creatinine Ratio: 12 (ref 9–23)
BUN: 10 mg/dL (ref 6–24)
CALCIUM: 9.3 mg/dL (ref 8.7–10.2)
CHLORIDE: 102 mmol/L (ref 96–106)
CO2: 26 mmol/L (ref 18–29)
Creatinine, Ser: 0.81 mg/dL (ref 0.57–1.00)
GFR calc Af Amer: 99 mL/min/{1.73_m2} (ref >59)
GFR calc non Af Amer: 86 mL/min/{1.73_m2} (ref >59)
Glucose: 80 mg/dL (ref 65–99)
POTASSIUM: 3.9 mmol/L (ref 3.5–5.2)
SODIUM: 141 mmol/L (ref 134–144)

## 2016-08-14 MED ORDER — HYDROCHLOROTHIAZIDE 12.5 MG PO CAPS: 12.5000 mg | ORAL_CAPSULE | Freq: Every day | ORAL | 3 refills | Status: DC

## 2016-08-14 MED ORDER — LOSARTAN POTASSIUM 25 MG PO TABS: 25.0000 mg | ORAL_TABLET | Freq: Every day | ORAL | 3 refills | Status: DC

## 2016-08-14 NOTE — Progress Notes (Signed)
[facility] Attending  Case discussed with Dr. Patel,Vishal at the time of the visit.  We reviewed the resident's history and exam and pertinent patient test results.  I agree with the assessment, diagnosis, and plan of care documented in the resident's note.  

## 2016-11-21 ENCOUNTER — Emergency Department (HOSPITAL_COMMUNITY): Payer: No Typology Code available for payment source

## 2016-11-21 ENCOUNTER — Encounter (HOSPITAL_COMMUNITY): Payer: Self-pay | Admitting: Emergency Medicine

## 2016-11-21 ENCOUNTER — Emergency Department (HOSPITAL_COMMUNITY)
Admission: EM | Admit: 2016-11-21 | Discharge: 2016-11-21 | Disposition: A | Discharge status: Home / Self Care | Payer: No Typology Code available for payment source | Attending: Emergency Medicine | Admitting: Emergency Medicine

## 2016-11-21 DIAGNOSIS — Y999 Unspecified external cause status: Secondary | ICD-10-CM | POA: Diagnosis not present

## 2016-11-21 DIAGNOSIS — I10 Essential (primary) hypertension: Secondary | ICD-10-CM | POA: Insufficient documentation

## 2016-11-21 DIAGNOSIS — Y92410 Unspecified street and highway as the place of occurrence of the external cause: Secondary | ICD-10-CM | POA: Diagnosis not present

## 2016-11-21 DIAGNOSIS — M5136 Other intervertebral disc degeneration, lumbar region: Secondary | ICD-10-CM

## 2016-11-21 DIAGNOSIS — M5137 Other intervertebral disc degeneration, lumbosacral region: Secondary | ICD-10-CM | POA: Diagnosis not present

## 2016-11-21 DIAGNOSIS — Y939 Activity, unspecified: Secondary | ICD-10-CM | POA: Diagnosis not present

## 2016-11-21 DIAGNOSIS — M545 Low back pain: Secondary | ICD-10-CM | POA: Diagnosis present

## 2016-11-21 MED ORDER — IBUPROFEN 800 MG PO TABS
800.0000 mg | ORAL_TABLET | Freq: Once | ORAL | Status: AC
  Administered 2016-11-21: 800 mg via ORAL
  Filled 2016-11-21: qty 1

## 2016-11-21 MED ORDER — IBUPROFEN 800 MG PO TABS: 800.0000 mg | ORAL_TABLET | Freq: Three times a day (TID) | ORAL | 0 refills | Status: DC

## 2016-11-21 MED ORDER — CYCLOBENZAPRINE HCL 10 MG PO TABS: 5.0000 mg | ORAL_TABLET | Freq: Two times a day (BID) | ORAL | 0 refills | Status: DC | PRN

## 2016-11-21 NOTE — ED Notes (Signed)
Called for treatment.  Did not answer x1
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
## 2016-11-21 NOTE — ED Notes (Signed)
Patient d/c'd self care.  F/U and medications reviewed.  Patient verbalized understanding. 

## 2016-11-26 ENCOUNTER — Emergency Department (HOSPITAL_COMMUNITY)
Admission: EM | Admit: 2016-11-26 | Discharge: 2016-11-26 | Disposition: A | Discharge status: Home / Self Care | Payer: BLUE CROSS/BLUE SHIELD | Attending: Emergency Medicine | Admitting: Emergency Medicine

## 2016-11-26 ENCOUNTER — Encounter (HOSPITAL_COMMUNITY): Payer: Self-pay | Admitting: Emergency Medicine

## 2016-11-26 DIAGNOSIS — M25562 Pain in left knee: Secondary | ICD-10-CM

## 2016-11-26 DIAGNOSIS — G44309 Post-traumatic headache, unspecified, not intractable: Secondary | ICD-10-CM | POA: Diagnosis not present

## 2016-11-26 DIAGNOSIS — I10 Essential (primary) hypertension: Secondary | ICD-10-CM | POA: Insufficient documentation
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
## 2016-11-26 NOTE — ED Notes (Signed)
Patient was alert, oriented and stable upon discharge. RN went over AVS and patient had no further questions.  

## 2016-11-26 NOTE — Discharge Instructions (Signed)
Please fill medications prescribed to you Please follow up with orthopedics if symptoms are getting worse
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
## 2016-12-07 ENCOUNTER — Emergency Department (HOSPITAL_COMMUNITY): Payer: BLUE CROSS/BLUE SHIELD

## 2016-12-07 ENCOUNTER — Encounter (HOSPITAL_COMMUNITY): Payer: Self-pay | Admitting: *Deleted

## 2016-12-07 ENCOUNTER — Emergency Department (HOSPITAL_COMMUNITY)
Admission: EM | Admit: 2016-12-07 | Discharge: 2016-12-08 | Disposition: A | Discharge status: Home / Self Care | Payer: BLUE CROSS/BLUE SHIELD | Attending: Emergency Medicine | Admitting: Emergency Medicine

## 2016-12-07 DIAGNOSIS — I10 Essential (primary) hypertension: Secondary | ICD-10-CM | POA: Insufficient documentation

## 2016-12-07 DIAGNOSIS — Y999 Unspecified external cause status: Secondary | ICD-10-CM | POA: Diagnosis not present

## 2016-12-07 DIAGNOSIS — Y92410 Unspecified street and highway as the place of occurrence of the external cause: Secondary | ICD-10-CM | POA: Insufficient documentation

## 2016-12-07 DIAGNOSIS — S80211A Abrasion, right knee, initial encounter: Secondary | ICD-10-CM | POA: Insufficient documentation

## 2016-12-07 DIAGNOSIS — Y939 Activity, unspecified: Secondary | ICD-10-CM | POA: Diagnosis not present

## 2016-12-07 DIAGNOSIS — R030 Elevated blood-pressure reading, without diagnosis of hypertension: Secondary | ICD-10-CM

## 2016-12-07 MED ORDER — BACLOFEN 10 MG PO TABS: 10.0000 mg | ORAL_TABLET | Freq: Three times a day (TID) | ORAL | 0 refills | Status: AC

## 2016-12-07 MED ORDER — MELOXICAM 15 MG PO TABS: 15.0000 mg | ORAL_TABLET | Freq: Every day | ORAL | 0 refills | Status: DC
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
## 2016-12-07 NOTE — Discharge Instructions (Signed)
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
## 2016-12-08 NOTE — ED Notes (Signed)
Patient d/c'd self care.  F/U and medications reviewed.  Patient verbalized understanding. 

## 2016-12-08 NOTE — ED Notes (Addendum)
Informed provider of patient BP at d/c.  Patient advised that has hx of HTN and takes her BP medication in the AM.  Patient is asymptomatic.

## 2016-12-26 ENCOUNTER — Encounter (HOSPITAL_COMMUNITY): Payer: Self-pay | Admitting: *Deleted

## 2016-12-26 ENCOUNTER — Ambulatory Visit (HOSPITAL_COMMUNITY)
Admission: EM | Admit: 2016-12-26 | Discharge: 2016-12-26 | Disposition: A | Discharge status: Home / Self Care | Payer: BLUE CROSS/BLUE SHIELD | Attending: Internal Medicine | Admitting: Internal Medicine

## 2016-12-26 DIAGNOSIS — M25561 Pain in right knee: Secondary | ICD-10-CM

## 2016-12-26 DIAGNOSIS — R0789 Other chest pain: Secondary | ICD-10-CM

## 2016-12-26 MED ORDER — NAPROXEN 500 MG PO TABS: 500.0000 mg | ORAL_TABLET | Freq: Two times a day (BID) | ORAL | 0 refills | Status: DC

## 2016-12-26 MED ORDER — CYCLOBENZAPRINE HCL 5 MG PO TABS: 5.0000 mg | ORAL_TABLET | Freq: Every day | ORAL | 0 refills | Status: DC
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
## 2017-01-07 ENCOUNTER — Ambulatory Visit (INDEPENDENT_AMBULATORY_CARE_PROVIDER_SITE_OTHER): Payer: BLUE CROSS/BLUE SHIELD | Admitting: Internal Medicine

## 2017-01-07 ENCOUNTER — Ambulatory Visit (HOSPITAL_COMMUNITY)
Admission: RE | Admit: 2017-01-07 | Discharge: 2017-01-07 | Disposition: A | Discharge status: Home / Self Care | Payer: BLUE CROSS/BLUE SHIELD | Source: Ambulatory Visit | Attending: Internal Medicine | Admitting: Internal Medicine

## 2017-01-07 DIAGNOSIS — N644 Mastodynia: Secondary | ICD-10-CM | POA: Diagnosis not present

## 2017-01-07 DIAGNOSIS — Z041 Encounter for examination and observation following transport accident: Secondary | ICD-10-CM

## 2017-01-07 DIAGNOSIS — Z043 Encounter for examination and observation following other accident: Secondary | ICD-10-CM

## 2017-01-07 DIAGNOSIS — R1031 Right lower quadrant pain: Secondary | ICD-10-CM

## 2017-01-07 DIAGNOSIS — M25561 Pain in right knee: Secondary | ICD-10-CM

## 2017-01-07 DIAGNOSIS — N6315 Unspecified lump in the right breast, overlapping quadrants: Secondary | ICD-10-CM | POA: Insufficient documentation

## 2017-01-07 DIAGNOSIS — N6310 Unspecified lump in the right breast, unspecified quadrant: Secondary | ICD-10-CM | POA: Diagnosis not present

## 2017-01-07 MED ORDER — NAPROXEN 500 MG PO TABS: 500.0000 mg | ORAL_TABLET | Freq: Two times a day (BID) | ORAL | 0 refills | Status: DC

## 2017-01-07 NOTE — Patient Instructions (Addendum)
We will get mammogram of your right breast.  Will get hip xray of right hip.  Take naproxen twice a day daily for next 1 week.  Follow up with your regular doctor in 1 month.
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
## 2017-01-08 ENCOUNTER — Other Ambulatory Visit: Payer: Self-pay | Admitting: Internal Medicine

## 2017-01-08 ENCOUNTER — Telehealth: Payer: Self-pay | Admitting: Internal Medicine

## 2017-01-08 DIAGNOSIS — N6315 Unspecified lump in the right breast, overlapping quadrants: Secondary | ICD-10-CM

## 2017-01-08 DIAGNOSIS — N6310 Unspecified lump in the right breast, unspecified quadrant: Principal | ICD-10-CM

## 2017-01-08 NOTE — Telephone Encounter (Signed)
Dr Tasia Catchingsahmed, have you called pt?

## 2017-01-08 NOTE — Progress Notes (Signed)
Internal Medicine Clinic Attending  Case discussed with Dr. Ahmed at the time of the visit.  We reviewed the resident's history and exam and pertinent patient test results.  I agree with the assessment, diagnosis, and plan of care documented in the resident's note. 

## 2017-01-08 NOTE — Telephone Encounter (Signed)
Patient states had message from dr Tasia Catchingsahmed.

## 2017-01-09 ENCOUNTER — Other Ambulatory Visit: Payer: Self-pay | Admitting: Sports Medicine

## 2017-01-09 DIAGNOSIS — M542 Cervicalgia: Secondary | ICD-10-CM

## 2017-01-09 NOTE — Telephone Encounter (Signed)
I have called her and left VM yesterday to tell her that the hip xray looked ok. No fractures.

## 2017-01-09 NOTE — Telephone Encounter (Signed)
Called and lm for rtc 

## 2017-01-09 NOTE — Telephone Encounter (Signed)
Called from pt - informed "her that the hip xray looked ok. No fractures" per Dr Tasia CatchingsAhmed. Stated she still having pain and has ortho appt- wanted to know if she should keep appt. I told her if still having pain, good to keep appt with specialist. Stated she would.

## 2017-01-09 NOTE — Telephone Encounter (Signed)
Pain will likely improve with supportive treatment but since she has ortho appt already I think she should see them for re-assurance.

## 2017-01-14 ENCOUNTER — Ambulatory Visit
Admission: RE | Admit: 2017-01-14 | Discharge: 2017-01-14 | Disposition: A | Discharge status: Home / Self Care | Payer: BLUE CROSS/BLUE SHIELD | Source: Ambulatory Visit | Attending: Internal Medicine | Admitting: Internal Medicine

## 2017-01-14 DIAGNOSIS — N6310 Unspecified lump in the right breast, unspecified quadrant: Principal | ICD-10-CM

## 2017-01-14 DIAGNOSIS — N6315 Unspecified lump in the right breast, overlapping quadrants: Secondary | ICD-10-CM

## 2017-01-14 HISTORY — DX: Unspecified injury of thorax, initial encounter: S29.9XXA

## 2017-01-21 ENCOUNTER — Ambulatory Visit
Admission: RE | Admit: 2017-01-21 | Discharge: 2017-01-21 | Disposition: A | Discharge status: Home / Self Care | Payer: BLUE CROSS/BLUE SHIELD | Source: Ambulatory Visit | Attending: Sports Medicine | Admitting: Sports Medicine

## 2017-01-21 DIAGNOSIS — M542 Cervicalgia: Secondary | ICD-10-CM

## 2017-02-01 ENCOUNTER — Ambulatory Visit
Admission: RE | Admit: 2017-02-01 | Discharge: 2017-02-01 | Disposition: A | Discharge status: Home / Self Care | Payer: BLUE CROSS/BLUE SHIELD | Source: Ambulatory Visit | Attending: Sports Medicine | Admitting: Sports Medicine

## 2017-02-01 ENCOUNTER — Other Ambulatory Visit: Payer: Self-pay | Admitting: Sports Medicine

## 2017-02-01 DIAGNOSIS — M542 Cervicalgia: Secondary | ICD-10-CM

## 2017-02-04 ENCOUNTER — Ambulatory Visit: Payer: BLUE CROSS/BLUE SHIELD

## 2017-02-04 ENCOUNTER — Other Ambulatory Visit: Payer: Self-pay | Admitting: Sports Medicine

## 2017-02-04 DIAGNOSIS — M545 Low back pain: Secondary | ICD-10-CM

## 2017-02-13 ENCOUNTER — Ambulatory Visit: Payer: BLUE CROSS/BLUE SHIELD

## 2017-02-15 ENCOUNTER — Ambulatory Visit
Admission: RE | Admit: 2017-02-15 | Discharge: 2017-02-15 | Disposition: A | Discharge status: Home / Self Care | Payer: BLUE CROSS/BLUE SHIELD | Source: Ambulatory Visit | Attending: Sports Medicine | Admitting: Sports Medicine

## 2017-02-15 DIAGNOSIS — M545 Low back pain: Secondary | ICD-10-CM

## 2017-05-20 ENCOUNTER — Ambulatory Visit (INDEPENDENT_AMBULATORY_CARE_PROVIDER_SITE_OTHER): Payer: BLUE CROSS/BLUE SHIELD | Admitting: Internal Medicine

## 2017-05-20 ENCOUNTER — Encounter: Payer: Self-pay | Admitting: Internal Medicine

## 2017-05-20 VITALS — BP 154/89 | HR 76 | Temp 98.0°F | Wt 207.9 lb

## 2017-05-20 DIAGNOSIS — R222 Localized swelling, mass and lump, trunk: Secondary | ICD-10-CM | POA: Diagnosis not present

## 2017-05-20 DIAGNOSIS — Z833 Family history of diabetes mellitus: Secondary | ICD-10-CM

## 2017-05-20 DIAGNOSIS — L83 Acanthosis nigricans: Secondary | ICD-10-CM

## 2017-05-20 DIAGNOSIS — Z87898 Personal history of other specified conditions: Secondary | ICD-10-CM | POA: Insufficient documentation

## 2017-05-20 DIAGNOSIS — Z8639 Personal history of other endocrine, nutritional and metabolic disease: Secondary | ICD-10-CM

## 2017-05-20 HISTORY — DX: Personal history of other specified conditions: Z87.898

## 2017-05-20 NOTE — Assessment & Plan Note (Signed)
Patient presents for evaluation of supraclavicular fullness, noticed by other providers prior to this appointment. Today this fullness is equal bilaterally, no discrete lymphadenopathy appreciated in this region or in cervical or axillary nodes.Her thyroid is diffusely enlarged though not confluent with the fullness. This appears to be more of a fat deposition rather than a more concerning lymphadenopathy.  Will continue to monitor rather than pursue more aggressive workup. Will check a TSH given enlarged thyroid on exam.

## 2017-05-20 NOTE — Patient Instructions (Addendum)
Thank you for coming in today [name]. We're glad your chiropractor is looking out for you and has you health in mind. We don't feel like there is anything dangerous causing this swelling in your neck based on how things look today. Your thyroid, which is near to the area, did feel a little enlarged so we are going to check some blood work and will let you know if anything is abnormal. We are also going to check a test to see how the sugar in your blood is doing.

## 2017-05-20 NOTE — Assessment & Plan Note (Signed)
Patient had hemoglobin A1c of 6.0 in 2015, no further results available. Ancanthosis nigricans is present on exam today which may indicate her prediabetes has progressed with increased insulin resistance. Will repeat A1c
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
## 2017-05-21 LAB — HEMOGLOBIN A1C
Est. average glucose Bld gHb Est-mCnc: 128 mg/dL
HEMOGLOBIN A1C: 6.1 % — AB (ref 4.8–5.6)

## 2017-05-21 LAB — TSH: TSH: 0.698 u[IU]/mL (ref 0.450–4.500)

## 2017-05-23 NOTE — Progress Notes (Signed)
Internal Medicine Clinic Attending  I saw and evaluated the patient.  I personally confirmed the key portions of the history and exam documented by Dr. Harden and I reviewed pertinent patient test results.  The assessment, diagnosis, and plan were formulated together and I agree with the documentation in the resident's note.  

## 2017-07-10 ENCOUNTER — Ambulatory Visit (INDEPENDENT_AMBULATORY_CARE_PROVIDER_SITE_OTHER): Payer: Self-pay | Admitting: Internal Medicine

## 2017-07-10 ENCOUNTER — Ambulatory Visit (HOSPITAL_COMMUNITY)
Admission: RE | Admit: 2017-07-10 | Discharge: 2017-07-10 | Disposition: A | Discharge status: Home / Self Care | Payer: BLUE CROSS/BLUE SHIELD | Source: Ambulatory Visit | Attending: Internal Medicine | Admitting: Internal Medicine

## 2017-07-10 VITALS — BP 142/78 | HR 77 | Temp 98.5°F | Ht 62.0 in | Wt 206.6 lb

## 2017-07-10 DIAGNOSIS — R221 Localized swelling, mass and lump, neck: Secondary | ICD-10-CM

## 2017-07-10 DIAGNOSIS — R222 Localized swelling, mass and lump, trunk: Secondary | ICD-10-CM

## 2017-07-10 DIAGNOSIS — E010 Iodine-deficiency related diffuse (endemic) goiter: Secondary | ICD-10-CM

## 2017-07-10 DIAGNOSIS — Z79899 Other long term (current) drug therapy: Secondary | ICD-10-CM

## 2017-07-10 DIAGNOSIS — Z87828 Personal history of other (healed) physical injury and trauma: Secondary | ICD-10-CM

## 2017-07-10 DIAGNOSIS — I10 Essential (primary) hypertension: Secondary | ICD-10-CM

## 2017-07-10 DIAGNOSIS — L83 Acanthosis nigricans: Secondary | ICD-10-CM

## 2017-07-10 DIAGNOSIS — M542 Cervicalgia: Secondary | ICD-10-CM

## 2017-07-10 LAB — BASIC METABOLIC PANEL
Anion gap: 5 (ref 5–15)
BUN: 9 mg/dL (ref 6–20)
CALCIUM: 9.2 mg/dL (ref 8.9–10.3)
CHLORIDE: 104 mmol/L (ref 101–111)
CO2: 27 mmol/L (ref 22–32)
CREATININE: 0.69 mg/dL (ref 0.44–1.00)
Glucose, Bld: 83 mg/dL (ref 65–99)
Potassium: 3.5 mmol/L (ref 3.5–5.1)
SODIUM: 136 mmol/L (ref 135–145)

## 2017-07-10 MED ORDER — LOSARTAN POTASSIUM 25 MG PO TABS: 25.0000 mg | ORAL_TABLET | Freq: Every day | ORAL | 3 refills | Status: DC

## 2017-07-10 NOTE — Assessment & Plan Note (Addendum)
BP Readings from Last 3 Encounters:  07/10/17 (!) 142/78  05/20/17 (!) 154/89  01/07/17 (!) 174/86  BP mildly elevated today, but close to goal of <140/90. The patient states she did not take her Losartan this AM because she ran out and needs refills. Current regimen Losartan 25 mg and HCTZ 12.5 mg daily. BMET was ordered>>Creatinine and electrolytes are all within normal limits.   Plan: -Refilled Losartan 25 mg -Continue HCTZ 12.5 mg

## 2017-07-10 NOTE — Assessment & Plan Note (Signed)
The patient's bilateral supraclavicular fullness/swelling is most likely benign. Cervical spine MRI completed in April 2018 was reassuring. The swelling may be secondary to lung tissue or adipose tissue. If it was lung tissue or adipose then the patient should not be experiencing any pain. Her pain with neck rotation to the right may be secondary to tendonitis. No adenopathy or signs of cellulitis was noted on exam. She does of a diffusely enlarged thyroid gland, but recent TSH was WNL and she has no hyperthyroid symptoms.   Plan: -Soft tissue ultrasound of the neck

## 2017-07-10 NOTE — Patient Instructions (Addendum)
[name],   It was a pleasure meeting you today.   I have refilled you Losartan. I will call you if your blood work is abnormal.  I will also call you with the ultrasound results and discuss next steps in treatment, if any.   For your neck pain, you can take over the counter Tylenol.
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
## 2017-07-14 ENCOUNTER — Telehealth: Payer: Self-pay | Admitting: Internal Medicine

## 2017-07-14 NOTE — Progress Notes (Signed)
Patient ID: Courtney Drake, female   DOB: 07/12/1966, 51 y.o.   MRN: [id number]  I saw and evaluated the patient.  I personally confirmed the key portions of Dr. Hulan Saas history and exam and reviewed pertinent patient test results.  The assessment, diagnosis, and plan were formulated together and I agree with the documentation in the resident's note.  In addition, my examination failed to detect any supraclavicular adenopathy.  There were no appreciable thyroid nodules, as well.  I personally reviewed the prior cervical MRI and the neck soft tissues were unremarkable.  I believe the "swelling" is secondary to adipose tissue Vs apicies of the lungs (less likely).  Because of the pain with turning of the head to the right we will obtain an ultrasound of the neck to assess for a new soft tissue lesion.

## 2017-07-15 ENCOUNTER — Encounter: Payer: Self-pay | Admitting: Internal Medicine

## 2017-07-15 NOTE — Telephone Encounter (Signed)
Tried calling the patient several times about the results of her neck ultrasound, which were normal. There is no answer and no voicemail. Sent a letter with the results to the patient's address on file.

## 2017-07-17 ENCOUNTER — Telehealth: Payer: Self-pay | Admitting: Internal Medicine

## 2017-07-17 NOTE — Telephone Encounter (Signed)
Pt is calling back asking for ultrasound results. Please call pt @ [phone].

## 2017-07-17 NOTE — Telephone Encounter (Signed)
WOULD LIKE RESULTS ON ULTRASOUND LAST Thursday CALL [phone]

## 2017-07-17 NOTE — Telephone Encounter (Signed)
Okay. I have tried calling her multiple times, it seems that we had the wrong phone number documented. I'll call her this afternoon. Thanks!

## 2017-12-10 ENCOUNTER — Other Ambulatory Visit: Payer: Self-pay | Admitting: *Deleted

## 2017-12-12 MED ORDER — HYDROCHLOROTHIAZIDE 12.5 MG PO CAPS: 12.5000 mg | ORAL_CAPSULE | Freq: Every day | ORAL | 0 refills | Status: DC

## 2017-12-24 ENCOUNTER — Encounter: Payer: Self-pay | Admitting: Internal Medicine

## 2018-03-18 ENCOUNTER — Other Ambulatory Visit: Payer: Self-pay | Admitting: Internal Medicine

## 2018-05-18 ENCOUNTER — Encounter: Payer: Self-pay | Admitting: *Deleted

## 2018-06-23 ENCOUNTER — Other Ambulatory Visit: Payer: Self-pay | Admitting: Internal Medicine

## 2018-06-23 NOTE — Telephone Encounter (Signed)
Approve medication refill as patient is out but need followup as she hasn't been seen in clinic over a year and needs bp check.

## 2018-08-17 ENCOUNTER — Ambulatory Visit: Payer: BLUE CROSS/BLUE SHIELD

## 2018-08-17 ENCOUNTER — Encounter: Payer: Self-pay | Admitting: Internal Medicine

## 2018-09-09 ENCOUNTER — Telehealth: Payer: Self-pay | Admitting: *Deleted
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
## 2018-09-09 NOTE — Telephone Encounter (Signed)
[facility]. Patient's last OV 07/10/2017. HCTZ was refilled by new PCP on 06/23/2018 with the plan of patient scheduling appt. Patient no showed that appt on 08/17/2018. Request faxed back to [facility] declining refill as patient needs to make appt. Will also route to [employer]. Kinnie FeilL. [place], RN, BSN

## 2018-09-09 NOTE — Telephone Encounter (Signed)
Attempted to reach patient to schedule appt. Woman answered and states she will relay message to patient to call clinic. Kinnie FeilL. Akiah Bauch, RN, BSN

## 2018-09-09 NOTE — Telephone Encounter (Signed)
Called patient to offer an appt. Pt unable to afford to come to the clinic for an appt due to no ins.  Pt states she will wait and call back when she can to schedule an appointment.

## 2018-09-18 ENCOUNTER — Other Ambulatory Visit: Payer: Self-pay | Admitting: Internal Medicine
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
## 2018-10-19 ENCOUNTER — Encounter: Payer: Self-pay | Admitting: Internal Medicine

## 2018-10-19 ENCOUNTER — Other Ambulatory Visit: Payer: Self-pay

## 2018-10-19 ENCOUNTER — Ambulatory Visit: Payer: Self-pay | Admitting: Internal Medicine

## 2018-10-19 VITALS — BP 175/85 | HR 65 | Temp 98.4°F | Ht 62.0 in | Wt 207.6 lb

## 2018-10-19 DIAGNOSIS — Z1211 Encounter for screening for malignant neoplasm of colon: Secondary | ICD-10-CM

## 2018-10-19 DIAGNOSIS — Z23 Encounter for immunization: Secondary | ICD-10-CM

## 2018-10-19 DIAGNOSIS — I10 Essential (primary) hypertension: Secondary | ICD-10-CM

## 2018-10-19 DIAGNOSIS — Z79899 Other long term (current) drug therapy: Secondary | ICD-10-CM

## 2018-10-19 DIAGNOSIS — R011 Cardiac murmur, unspecified: Secondary | ICD-10-CM | POA: Insufficient documentation

## 2018-10-19 MED ORDER — LOSARTAN POTASSIUM 25 MG PO TABS: 25.0000 mg | ORAL_TABLET | Freq: Every day | ORAL | 3 refills | Status: DC

## 2018-10-19 MED ORDER — HYDROCHLOROTHIAZIDE 12.5 MG PO CAPS: 12.5000 mg | ORAL_CAPSULE | Freq: Every day | ORAL | 3 refills | Status: DC

## 2018-10-19 NOTE — Patient Instructions (Signed)
Thank you for coming to the clinic today. It was a pleasure to see you.   Please go to the lab on your way out  I have sent in refills for your losartan and HCTZ  I have referred you for a sleep study and echocardiogram, please call us if you have not heard about scheduling these in the next few weeks please give our office a call for an update.   FOLLOW-UP INSTRUCTIONS When: 1 -3 months with Dr. Cleaster CorinSeawell or in the acute care clinic  For: follow up of your blood pressure  What to bring: all of your medication bottles   Please call the internal medicine center clinic if you have any questions or concerns, we may be able to help and keep you from a long and expensive emergency room wait. Our clinic and after hours phone number is [phone], the best time to call is Monday through Friday 9 am to 4 pm but there is always someone available 24/7 if you have an emergency. If you need medication refills please notify your pharmacy one week in advance and they will send us a request.    DASH Eating Plan DASH stands for "Dietary Approaches to Stop Hypertension." The DASH eating plan is a healthy eating plan that has been shown to reduce high blood pressure (hypertension). It may also reduce your risk for type 2 diabetes, heart disease, and stroke. The DASH eating plan may also help with weight loss. What are tips for following this plan?  General guidelines  Avoid eating more than 2,300 mg (milligrams) of salt (sodium) a day. If you have hypertension, you may need to reduce your sodium intake to 1,500 mg a day.  Limit alcohol intake to no more than 1 drink a day for nonpregnant women and 2 drinks a day for men. One drink equals 12 oz of beer, 5 oz of wine, or 1 oz of hard liquor.  Work with your health care provider to maintain a healthy body weight or to lose weight. Ask what an ideal weight is for you.  Get at least 30 minutes of exercise that causes your heart to beat faster (aerobic exercise)  most days of the week. Activities may include walking, swimming, or biking.  Work with your health care provider or diet and nutrition specialist (dietitian) to adjust your eating plan to your individual calorie needs. Reading food labels   Check food labels for the amount of sodium per serving. Choose foods with less than 5 percent of the Daily Value of sodium. Generally, foods with less than 300 mg of sodium per serving fit into this eating plan.  To find whole grains, look for the word "whole" as the first word in the ingredient list. Shopping  Buy products labeled as "low-sodium" or "no salt added."  Buy fresh foods. Avoid canned foods and premade or frozen meals. Cooking  Avoid adding salt when cooking. Use salt-free seasonings or herbs instead of table salt or sea salt. Check with your health care provider or pharmacist before using salt substitutes.  Do not fry foods. Cook foods using healthy methods such as baking, boiling, grilling, and broiling instead.  Cook with heart-healthy oils, such as olive, canola, soybean, or sunflower oil. Meal planning  Eat a balanced diet that includes: ? 5 or more servings of fruits and vegetables each day. At each meal, try to fill half of your plate with fruits and vegetables. ? Up to 6-8 servings of whole grains each day. ?  Less than 6 oz of lean meat, poultry, or fish each day. A 3-oz serving of meat is about the same size as a deck of cards. One egg equals 1 oz. ? 2 servings of low-fat dairy each day. ? A serving of nuts, seeds, or beans 5 times each week. ? Heart-healthy fats. Healthy fats called Omega-3 fatty acids are found in foods such as flaxseeds and coldwater fish, like sardines, salmon, and mackerel.  Limit how much you eat of the following: ? Canned or prepackaged foods. ? Food that is high in trans fat, such as fried foods. ? Food that is high in saturated fat, such as fatty meat. ? Sweets, desserts, sugary drinks, and other  foods with added sugar. ? Full-fat dairy products.  Do not salt foods before eating.  Try to eat at least 2 vegetarian meals each week.  Eat more home-cooked food and less restaurant, buffet, and fast food.  When eating at a restaurant, ask that your food be prepared with less salt or no salt, if possible. What foods are recommended? The items listed may not be a complete list. Talk with your dietitian about what dietary choices are best for you. Grains Whole-grain or whole-wheat bread. Whole-grain or whole-wheat pasta. Brown rice. Orpah Cobbatmeal. Quinoa. Bulgur. Whole-grain and low-sodium cereals. Pita bread. Low-fat, low-sodium crackers. Whole-wheat flour tortillas. Vegetables Fresh or frozen vegetables (raw, steamed, roasted, or grilled). Low-sodium or reduced-sodium tomato and vegetable juice. Low-sodium or reduced-sodium tomato sauce and tomato paste. Low-sodium or reduced-sodium canned vegetables. Fruits All fresh, dried, or frozen fruit. Canned fruit in natural juice (without added sugar). Meat and other protein foods Skinless chicken or [place]key. Ground chicken or [place]key. Pork with fat trimmed off. Fish and seafood. Egg whites. Dried beans, peas, or lentils. Unsalted nuts, nut butters, and seeds. Unsalted canned beans. Lean cuts of beef with fat trimmed off. Low-sodium, lean deli meat. Dairy Low-fat (1%) or fat-free (skim) milk. Fat-free, low-fat, or reduced-fat cheeses. Nonfat, low-sodium ricotta or cottage cheese. Low-fat or nonfat yogurt. Low-fat, low-sodium cheese. Fats and oils Soft margarine without trans fats. Vegetable oil. Low-fat, reduced-fat, or light mayonnaise and salad dressings (reduced-sodium). Canola, safflower, olive, soybean, and sunflower oils. Avocado. Seasoning and other foods Herbs. Spices. Seasoning mixes without salt. Unsalted popcorn and pretzels. Fat-free sweets. What foods are not recommended? The items listed may not be a complete list. Talk with your dietitian  about what dietary choices are best for you. Grains Baked goods made with fat, such as croissants, muffins, or some breads. Dry pasta or rice meal packs. Vegetables Creamed or fried vegetables. Vegetables in a cheese sauce. Regular canned vegetables (not low-sodium or reduced-sodium). Regular canned tomato sauce and paste (not low-sodium or reduced-sodium). Regular tomato and vegetable juice (not low-sodium or reduced-sodium). Rosita FirePickles. Olives. Fruits Canned fruit in a light or heavy syrup. Fried fruit. Fruit in cream or butter sauce. Meat and other protein foods Fatty cuts of meat. Ribs. Fried meat. Tomasa BlaseBacon. Sausage. Bologna and other processed lunch meats. Salami. Fatback. Hotdogs. Bratwurst. Salted nuts and seeds. Canned beans with added salt. Canned or smoked fish. Whole eggs or egg yolks. Chicken or [place]key with skin. Dairy Whole or 2% milk, cream, and half-and-half. Whole or full-fat cream cheese. Whole-fat or sweetened yogurt. Full-fat cheese. Nondairy creamers. Whipped toppings. Processed cheese and cheese spreads. Fats and oils Butter. Stick margarine. Lard. Shortening. Ghee. Bacon fat. Tropical oils, such as coconut, palm kernel, or palm oil. Seasoning and other foods Salted popcorn and pretzels. Onion salt,  garlic salt, seasoned salt, table salt, and sea salt. Worcestershire sauce. Tartar sauce. Barbecue sauce. Teriyaki sauce. Soy sauce, including reduced-sodium. Steak sauce. Canned and packaged gravies. Fish sauce. Oyster sauce. Cocktail sauce. Horseradish that you find on the shelf. Ketchup. Mustard. Meat flavorings and tenderizers. Bouillon cubes. Hot sauce and Tabasco sauce. Premade or packaged marinades. Premade or packaged taco seasonings. Relishes. Regular salad dressings. Where to find more information:  National Heart, Lung, and Blood Institute: PopSteam.is  American Heart Association: www.heart.org Summary  The DASH eating plan is a healthy eating plan that has been shown  to reduce high blood pressure (hypertension). It may also reduce your risk for type 2 diabetes, heart disease, and stroke.  With the DASH eating plan, you should limit salt (sodium) intake to 2,300 mg a day. If you have hypertension, you may need to reduce your sodium intake to 1,500 mg a day.  When on the DASH eating plan, aim to eat more fresh fruits and vegetables, whole grains, lean proteins, low-fat dairy, and heart-healthy fats.  Work with your health care provider or diet and nutrition specialist (dietitian) to adjust your eating plan to your individual calorie needs. This information is not intended to replace advice given to you by your health care provider. Make sure you discuss any questions you have with your health care provider. Document Released: 10/03/2011 Document Revised: 10/07/2016 Document Reviewed: 10/07/2016 Elsevier Interactive Patient Education  2019 [employer].

## 2018-10-19 NOTE — Progress Notes (Signed)
Internal Medicine Clinic Attending  Case discussed with Dr. Blum at the time of the visit.  We reviewed the resident's history and exam and pertinent patient test results.  I agree with the assessment, diagnosis, and plan of care documented in the resident's note. 

## 2018-10-19 NOTE — Assessment & Plan Note (Addendum)
BP Readings from Last 3 Encounters:  10/19/18 (!) 175/85  07/10/17 (!) 142/78  05/20/17 (!) 154/89  Patient presents for follow up of hypertension and request medication refill. Hypertension was last evaluated over one year ago ( 06/2017), at that time her pressure was 142/78. BMET was ordered and creatinine and electrolytes were within the normal range. Her medications Losartan 25 mg qd and HCTZ 12.5 mg qd were continued. Today she says she has been out of the medications for over a month. [facility] $4 list reviewed and confirmed that losartan and HCTZ are each $4. Denies NSAID use. STOP bang consistent with high risk for OSA (snoring, daytime fatigue, hypertension, BMI greater than 35). She describes eating food prepared at the cafeteria often at work, she has difficulty cooking for herself because of the schedule and ends up needing to grab something quick and convenient.  - follow up BMP today  - refilled and resume losartan from 25 mg and, HCTZ from 12.5 mg qd  - follow up for recheck of blood pressure in 1-3 months  - obtain split night study, she request to have this scheduled in January when her work insurance has been established  - encouraged to start with small goal of one meal out of each week prepared in her home   ADDENDUM: We were notified that Ms. Karie Mainlandli cancelled her echo appointment because she could not find a time to fit this in with her work schedule, she also cancelled her Internal medicine follow up appointment for hypertension.

## 2018-10-19 NOTE — Assessment & Plan Note (Addendum)
Systolic murmur heard on exam today in a pattern consistent with aortic stenosis. Describes intermittent presyncope, the last was last week when she developed tunnel vision while walking around. She denies chest pain or dyspnea on exertion. Last echo 2015 with no noticed valvular abnormalities.  - follow up echo   ADDENDUM: We were notified that [name] cancelled her echo appointment because she could not find a time to fit this in with her work schedule, she also cancelled her Internal medicine follow up appointment for hypertension.

## 2018-10-20 LAB — BMP8+ANION GAP
Anion Gap: 15 mmol/L (ref 10.0–18.0)
BUN / CREAT RATIO: 9 (ref 9–23)
BUN: 6 mg/dL (ref 6–24)
CHLORIDE: 98 mmol/L (ref 96–106)
CO2: 24 mmol/L (ref 20–29)
CREATININE: 0.66 mg/dL (ref 0.57–1.00)
Calcium: 9.2 mg/dL (ref 8.7–10.2)
GFR calc Af Amer: 119 mL/min/{1.73_m2} (ref >59)
GFR calc non Af Amer: 103 mL/min/{1.73_m2} (ref >59)
GLUCOSE: 86 mg/dL (ref 65–99)
Potassium: 4 mmol/L (ref 3.5–5.2)
SODIUM: 137 mmol/L (ref 134–144)

## 2018-10-20 NOTE — Progress Notes (Signed)
Metabolic panel confirms normal electrolytes and renal function. Will proceed with current plans.

## 2018-11-02 ENCOUNTER — Ambulatory Visit: Payer: Self-pay

## 2018-11-11 ENCOUNTER — Ambulatory Visit (HOSPITAL_COMMUNITY): Payer: Self-pay

## 2018-11-27 ENCOUNTER — Encounter: Payer: Self-pay | Admitting: Internal Medicine

## 2018-11-27 ENCOUNTER — Ambulatory Visit: Payer: Self-pay

## 2019-01-08 ENCOUNTER — Encounter: Payer: Self-pay | Admitting: Internal Medicine
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
## 2019-06-02 ENCOUNTER — Ambulatory Visit (INDEPENDENT_AMBULATORY_CARE_PROVIDER_SITE_OTHER): Payer: Self-pay | Admitting: Internal Medicine

## 2019-06-02 ENCOUNTER — Other Ambulatory Visit: Payer: Self-pay

## 2019-06-02 ENCOUNTER — Encounter: Payer: Self-pay | Admitting: Internal Medicine

## 2019-06-02 VITALS — BP 141/84 | HR 72 | Temp 98.5°F | Wt 205.0 lb

## 2019-06-02 DIAGNOSIS — Z8742 Personal history of other diseases of the female genital tract: Secondary | ICD-10-CM

## 2019-06-02 DIAGNOSIS — N924 Excessive bleeding in the premenopausal period: Secondary | ICD-10-CM

## 2019-06-02 DIAGNOSIS — Z0000 Encounter for general adult medical examination without abnormal findings: Secondary | ICD-10-CM

## 2019-06-02 DIAGNOSIS — Z79899 Other long term (current) drug therapy: Secondary | ICD-10-CM

## 2019-06-02 DIAGNOSIS — Z1211 Encounter for screening for malignant neoplasm of colon: Secondary | ICD-10-CM

## 2019-06-02 DIAGNOSIS — R011 Cardiac murmur, unspecified: Secondary | ICD-10-CM

## 2019-06-02 DIAGNOSIS — I10 Essential (primary) hypertension: Secondary | ICD-10-CM

## 2019-06-02 NOTE — Patient Instructions (Addendum)
Thank you for allowing [place] to provide your care today. Today we discussed your hypertension and screening colonoscopy   I have ordered the following labs for you:    I will call if any are abnormal.    GI will contact you to schedule colonoscopy.   I have reordered your echocardiogram. You will be called to schedule an appointment.   Today we made the following changes to your medications:   Please follow-up in six months for labs and blood pressure check.    Please call the internal medicine center clinic if you have any questions or concerns, we may be able to help and keep you from a long and expensive emergency room wait. Our clinic and after hours phone number is [phone], the best time to call is Monday through Friday 9 am to 4 pm but there is always someone available 24/7 if you have an emergency. If you need medication refills please notify your pharmacy one week in advance and they will send [place] a request.

## 2019-06-03 ENCOUNTER — Telehealth: Payer: Self-pay | Admitting: *Deleted

## 2019-06-03 LAB — LIPID PANEL
Chol/HDL Ratio: 3.4 ratio (ref 0.0–4.4)
Cholesterol, Total: 171 mg/dL (ref 100–199)
HDL: 51 mg/dL (ref >39)
LDL Calculated: 102 mg/dL — ABNORMAL HIGH (ref 0–99)
Triglycerides: 89 mg/dL (ref 0–149)
VLDL Cholesterol Cal: 18 mg/dL (ref 5–40)

## 2019-06-03 LAB — BMP8+ANION GAP
Anion Gap: 18 mmol/L (ref 10.0–18.0)
BUN/Creatinine Ratio: 15 (ref 9–23)
BUN: 11 mg/dL (ref 6–24)
CO2: 24 mmol/L (ref 20–29)
Calcium: 9.1 mg/dL (ref 8.7–10.2)
Chloride: 101 mmol/L (ref 96–106)
Creatinine, Ser: 0.71 mg/dL (ref 0.57–1.00)
GFR calc Af Amer: 114 mL/min/{1.73_m2} (ref >59)
GFR calc non Af Amer: 99 mL/min/{1.73_m2} (ref >59)
Glucose: 138 mg/dL — ABNORMAL HIGH (ref 65–99)
Potassium: 4 mmol/L (ref 3.5–5.2)
Sodium: 143 mmol/L (ref 134–144)

## 2019-06-03 LAB — HIV ANTIBODY (ROUTINE TESTING W REFLEX): HIV Screen 4th Generation wRfx: NONREACTIVE

## 2019-06-03 NOTE — Assessment & Plan Note (Signed)
She has not had a menstrual cycle in over a year and is unsure when her last one was. She continues to take an over the counter iron supplement.

## 2019-06-03 NOTE — Progress Notes (Signed)
[facility] Medicine Clinic Attending  Case discussed with Dr. Seawell at the time of the visit.  We reviewed the resident's history and exam and pertinent patient test results.  I agree with the assessment, diagnosis, and plan of care documented in the resident's note.    

## 2019-06-03 NOTE — Telephone Encounter (Signed)
LVM FOR PATIENT TO RETURN CALL TO [facility] (NEEDING TO KNOW IF PATIENT ABLE TO PAY OUT OF POCKET / OR IF GOING TO MAKE APPOINTMENT WITH [facility].

## 2019-06-03 NOTE — Assessment & Plan Note (Signed)
History of presyncopal episodes in January with systolic murmur found on physical exam. Echo was ordered but not completed. Her symptoms have resolved, but II/VI murmur present on exam.   - echo ordered

## 2019-06-03 NOTE — Assessment & Plan Note (Signed)
BP Readings from Last 3 Encounters:  06/02/19 (!) 141/84  10/19/18 (!) 175/85  07/10/17 (!) 142/78   Rechecked blood pressure after sitting for 20 minutes, and this was 137/72. She takes losartan 25 mg qd and HCTZ 12.5 mg qd. She previously had OSA sleep study scheduled in January but had to cancel this due to work. She is undergoing several referrals today and will discuss obtaining sleep study again at f/u.   - continue HCTZ and losartan  - BMP  - discuss split night sleep study at follow-up

## 2019-06-03 NOTE — Assessment & Plan Note (Signed)
She is due for pap smear and mammogram. She states she will follow-up with her gynecologist to get these both done.  States tetanus vaccine was this year in January as she is just starting [employer] school.   - requested vaccination and gynecology records - screening colonoscopy ordered  - HIV screen today

## 2019-10-25 ENCOUNTER — Other Ambulatory Visit: Payer: Self-pay

## 2019-10-25 MED ORDER — LOSARTAN POTASSIUM 25 MG PO TABS: 25.0000 mg | ORAL_TABLET | Freq: Every day | ORAL | 3 refills | Status: DC

## 2019-11-01 ENCOUNTER — Other Ambulatory Visit: Payer: Self-pay | Admitting: *Deleted

## 2019-11-01 DIAGNOSIS — I10 Essential (primary) hypertension: Secondary | ICD-10-CM

## 2019-11-01 MED ORDER — HYDROCHLOROTHIAZIDE 12.5 MG PO CAPS: 12.5000 mg | ORAL_CAPSULE | Freq: Every day | ORAL | 1 refills | Status: DC

## 2020-01-27 ENCOUNTER — Ambulatory Visit: Payer: Medicaid Other | Attending: Internal Medicine

## 2020-01-27 DIAGNOSIS — Z23 Encounter for immunization: Secondary | ICD-10-CM

## 2020-01-27 NOTE — Progress Notes (Signed)
   Covid-19 Vaccination Clinic  Name:  Courtney Drake    MRN: [id number] DOB: Jun 01, 1966  01/27/2020  Courtney Drake was observed post Covid-19 immunization for 15 minutes without incident. She was provided with Vaccine Information Sheet and instruction to access the V-Safe system.   Courtney Drake was instructed to call 911 with any severe reactions post vaccine: Marland Kitchen Difficulty breathing  . Swelling of face and throat  . A fast heartbeat  . A bad rash all over body  . Dizziness and weakness   Immunizations Administered    Name Date Dose VIS Date Route   Pfizer COVID-19 Vaccine 01/27/2020  9:06 AM 0.3 mL 10/08/2019 Intramuscular   Manufacturer: [employer], [employer]   Lot: [id number]   NDC: [address]-5

## 2020-02-21 ENCOUNTER — Ambulatory Visit: Payer: Medicaid Other | Attending: Internal Medicine

## 2020-02-21 DIAGNOSIS — Z23 Encounter for immunization: Secondary | ICD-10-CM

## 2020-02-21 NOTE — Progress Notes (Signed)
   Covid-19 Vaccination Clinic  Name:  Courtney Drake    MRN: [id number] DOB: 1965/12/08  02/21/2020  Ms. [name] was observed post Covid-19 immunization for 15 minutes without incident. She was provided with Vaccine Information Sheet and instruction to access the V-Safe system.   [name] was instructed to call 911 with any severe reactions post vaccine: Marland Kitchen Difficulty breathing  . Swelling of face and throat  . A fast heartbeat  . A bad rash all over body  . Dizziness and weakness   Immunizations Administered    Name Date Dose VIS Date Route   Pfizer COVID-19 Vaccine 02/21/2020  8:45 AM 0.3 mL 12/22/2018 Intramuscular   Manufacturer: [employer], [employer]   Lot: [id number]   NDC: [address]-1

## 2020-04-10 DIAGNOSIS — H1013 Acute atopic conjunctivitis, bilateral: Secondary | ICD-10-CM | POA: Diagnosis not present

## 2020-08-31 ENCOUNTER — Other Ambulatory Visit: Payer: Self-pay

## 2020-08-31 DIAGNOSIS — I10 Essential (primary) hypertension: Secondary | ICD-10-CM

## 2020-08-31 MED ORDER — HYDROCHLOROTHIAZIDE 12.5 MG PO CAPS: 12.5000 mg | ORAL_CAPSULE | Freq: Every day | ORAL | 1 refills | Status: AC

## 2020-10-27 ENCOUNTER — Other Ambulatory Visit: Payer: Self-pay | Admitting: Internal Medicine

## 2020-10-30 NOTE — Telephone Encounter (Signed)
Last appt 05/2019 - called pt , no answer; left message to call the officee to schedule an appt.

## 2020-10-31 NOTE — Telephone Encounter (Signed)
Provided short refill until f/u can be scheduled.

## 2020-11-01 ENCOUNTER — Encounter: Payer: Self-pay | Admitting: Internal Medicine

## 2021-04-12 ENCOUNTER — Encounter: Payer: Self-pay | Admitting: *Deleted

## 2021-05-01 ENCOUNTER — Encounter: Payer: Self-pay | Admitting: *Deleted

## 2021-09-13 ENCOUNTER — Other Ambulatory Visit: Payer: Self-pay | Admitting: Student

## 2021-09-13 DIAGNOSIS — I10 Essential (primary) hypertension: Secondary | ICD-10-CM

## 2021-10-13 ENCOUNTER — Other Ambulatory Visit: Payer: Self-pay | Admitting: Student

## 2021-10-13 DIAGNOSIS — I10 Essential (primary) hypertension: Secondary | ICD-10-CM

## 2021-11-07 ENCOUNTER — Encounter: Payer: Self-pay | Admitting: *Deleted

## 2021-11-07 ENCOUNTER — Other Ambulatory Visit: Payer: Self-pay | Admitting: Student

## 2021-11-07 DIAGNOSIS — I10 Essential (primary) hypertension: Secondary | ICD-10-CM

## 2023-06-29 DIAGNOSIS — I160 Hypertensive urgency: Secondary | ICD-10-CM | POA: Diagnosis not present

## 2023-06-29 DIAGNOSIS — Z6836 Body mass index (BMI) 36.0-36.9, adult: Secondary | ICD-10-CM | POA: Diagnosis not present

## 2023-06-29 DIAGNOSIS — R29702 NIHSS score 2: Secondary | ICD-10-CM | POA: Diagnosis not present

## 2023-06-29 DIAGNOSIS — E1165 Type 2 diabetes mellitus with hyperglycemia: Secondary | ICD-10-CM | POA: Diagnosis not present

## 2023-06-29 DIAGNOSIS — Z794 Long term (current) use of insulin: Secondary | ICD-10-CM | POA: Diagnosis not present

## 2023-06-29 DIAGNOSIS — Z7982 Long term (current) use of aspirin: Secondary | ICD-10-CM | POA: Diagnosis not present

## 2023-06-29 DIAGNOSIS — D72829 Elevated white blood cell count, unspecified: Secondary | ICD-10-CM | POA: Diagnosis not present

## 2023-06-29 DIAGNOSIS — I10 Essential (primary) hypertension: Secondary | ICD-10-CM | POA: Diagnosis not present

## 2023-06-29 DIAGNOSIS — G8194 Hemiplegia, unspecified affecting left nondominant side: Secondary | ICD-10-CM | POA: Diagnosis not present

## 2023-06-29 DIAGNOSIS — R339 Retention of urine, unspecified: Secondary | ICD-10-CM | POA: Diagnosis not present

## 2023-06-29 DIAGNOSIS — E669 Obesity, unspecified: Secondary | ICD-10-CM | POA: Diagnosis not present

## 2023-06-29 DIAGNOSIS — Z7902 Long term (current) use of antithrombotics/antiplatelets: Secondary | ICD-10-CM | POA: Diagnosis not present

## 2023-06-29 DIAGNOSIS — I639 Cerebral infarction, unspecified: Secondary | ICD-10-CM | POA: Diagnosis not present

## 2023-06-29 DIAGNOSIS — Z91148 Patient's other noncompliance with medication regimen for other reason: Secondary | ICD-10-CM | POA: Diagnosis not present

## 2023-06-30 DIAGNOSIS — I639 Cerebral infarction, unspecified: Secondary | ICD-10-CM | POA: Diagnosis not present

## 2023-06-30 DIAGNOSIS — D72829 Elevated white blood cell count, unspecified: Secondary | ICD-10-CM | POA: Diagnosis not present

## 2023-06-30 DIAGNOSIS — I160 Hypertensive urgency: Secondary | ICD-10-CM | POA: Diagnosis not present

## 2023-06-30 DIAGNOSIS — R29702 NIHSS score 2: Secondary | ICD-10-CM | POA: Diagnosis not present

## 2023-06-30 DIAGNOSIS — G8194 Hemiplegia, unspecified affecting left nondominant side: Secondary | ICD-10-CM | POA: Diagnosis not present

## 2023-06-30 DIAGNOSIS — E1165 Type 2 diabetes mellitus with hyperglycemia: Secondary | ICD-10-CM | POA: Diagnosis not present

## 2023-06-30 DIAGNOSIS — R339 Retention of urine, unspecified: Secondary | ICD-10-CM | POA: Diagnosis not present

## 2023-07-01 DIAGNOSIS — I639 Cerebral infarction, unspecified: Secondary | ICD-10-CM | POA: Diagnosis not present

## 2023-07-01 DIAGNOSIS — E1165 Type 2 diabetes mellitus with hyperglycemia: Secondary | ICD-10-CM | POA: Diagnosis not present

## 2023-07-01 DIAGNOSIS — D72829 Elevated white blood cell count, unspecified: Secondary | ICD-10-CM | POA: Diagnosis not present

## 2023-07-01 DIAGNOSIS — I160 Hypertensive urgency: Secondary | ICD-10-CM | POA: Diagnosis not present

## 2023-07-01 DIAGNOSIS — R339 Retention of urine, unspecified: Secondary | ICD-10-CM | POA: Diagnosis not present

## 2023-07-02 DIAGNOSIS — R29702 NIHSS score 2: Secondary | ICD-10-CM | POA: Diagnosis not present

## 2023-07-02 DIAGNOSIS — I639 Cerebral infarction, unspecified: Secondary | ICD-10-CM | POA: Diagnosis not present

## 2023-07-02 DIAGNOSIS — R339 Retention of urine, unspecified: Secondary | ICD-10-CM | POA: Diagnosis not present

## 2023-07-02 DIAGNOSIS — I160 Hypertensive urgency: Secondary | ICD-10-CM | POA: Diagnosis not present

## 2023-07-02 DIAGNOSIS — E1165 Type 2 diabetes mellitus with hyperglycemia: Secondary | ICD-10-CM | POA: Diagnosis not present

## 2023-07-02 DIAGNOSIS — G8194 Hemiplegia, unspecified affecting left nondominant side: Secondary | ICD-10-CM | POA: Diagnosis not present

## 2023-07-02 DIAGNOSIS — D72829 Elevated white blood cell count, unspecified: Secondary | ICD-10-CM | POA: Diagnosis not present

## 2023-07-03 DIAGNOSIS — D72829 Elevated white blood cell count, unspecified: Secondary | ICD-10-CM | POA: Diagnosis not present

## 2023-07-03 DIAGNOSIS — G8194 Hemiplegia, unspecified affecting left nondominant side: Secondary | ICD-10-CM | POA: Diagnosis not present

## 2023-07-03 DIAGNOSIS — R339 Retention of urine, unspecified: Secondary | ICD-10-CM | POA: Diagnosis not present

## 2023-07-03 DIAGNOSIS — I639 Cerebral infarction, unspecified: Secondary | ICD-10-CM | POA: Diagnosis not present

## 2023-07-03 DIAGNOSIS — E1165 Type 2 diabetes mellitus with hyperglycemia: Secondary | ICD-10-CM | POA: Diagnosis not present

## 2023-07-03 DIAGNOSIS — I160 Hypertensive urgency: Secondary | ICD-10-CM | POA: Diagnosis not present

## 2023-07-03 DIAGNOSIS — R29702 NIHSS score 2: Secondary | ICD-10-CM | POA: Diagnosis not present

## 2023-07-04 DIAGNOSIS — G8194 Hemiplegia, unspecified affecting left nondominant side: Secondary | ICD-10-CM | POA: Diagnosis not present

## 2023-07-04 DIAGNOSIS — D72829 Elevated white blood cell count, unspecified: Secondary | ICD-10-CM | POA: Diagnosis not present

## 2023-07-04 DIAGNOSIS — E1165 Type 2 diabetes mellitus with hyperglycemia: Secondary | ICD-10-CM | POA: Diagnosis not present

## 2023-07-04 DIAGNOSIS — R29702 NIHSS score 2: Secondary | ICD-10-CM | POA: Diagnosis not present

## 2023-07-04 DIAGNOSIS — I639 Cerebral infarction, unspecified: Secondary | ICD-10-CM | POA: Diagnosis not present

## 2023-07-04 DIAGNOSIS — R339 Retention of urine, unspecified: Secondary | ICD-10-CM | POA: Diagnosis not present

## 2023-07-04 DIAGNOSIS — I160 Hypertensive urgency: Secondary | ICD-10-CM | POA: Diagnosis not present

## 2023-07-05 DIAGNOSIS — D72829 Elevated white blood cell count, unspecified: Secondary | ICD-10-CM | POA: Diagnosis not present

## 2023-07-05 DIAGNOSIS — E1165 Type 2 diabetes mellitus with hyperglycemia: Secondary | ICD-10-CM | POA: Diagnosis not present

## 2023-07-05 DIAGNOSIS — I160 Hypertensive urgency: Secondary | ICD-10-CM | POA: Diagnosis not present

## 2023-07-05 DIAGNOSIS — R339 Retention of urine, unspecified: Secondary | ICD-10-CM | POA: Diagnosis not present

## 2023-07-05 DIAGNOSIS — G8194 Hemiplegia, unspecified affecting left nondominant side: Secondary | ICD-10-CM | POA: Diagnosis not present

## 2023-07-05 DIAGNOSIS — I639 Cerebral infarction, unspecified: Secondary | ICD-10-CM | POA: Diagnosis not present

## 2023-07-05 DIAGNOSIS — R29702 NIHSS score 2: Secondary | ICD-10-CM | POA: Diagnosis not present

## 2023-07-06 DIAGNOSIS — R339 Retention of urine, unspecified: Secondary | ICD-10-CM | POA: Diagnosis not present

## 2023-07-06 DIAGNOSIS — I160 Hypertensive urgency: Secondary | ICD-10-CM | POA: Diagnosis not present

## 2023-07-06 DIAGNOSIS — E1165 Type 2 diabetes mellitus with hyperglycemia: Secondary | ICD-10-CM | POA: Diagnosis not present

## 2023-07-06 DIAGNOSIS — R29702 NIHSS score 2: Secondary | ICD-10-CM | POA: Diagnosis not present

## 2023-07-06 DIAGNOSIS — I639 Cerebral infarction, unspecified: Secondary | ICD-10-CM | POA: Diagnosis not present

## 2023-07-06 DIAGNOSIS — G8194 Hemiplegia, unspecified affecting left nondominant side: Secondary | ICD-10-CM | POA: Diagnosis not present

## 2023-07-06 DIAGNOSIS — D72829 Elevated white blood cell count, unspecified: Secondary | ICD-10-CM | POA: Diagnosis not present

## 2023-07-07 DIAGNOSIS — R29702 NIHSS score 2: Secondary | ICD-10-CM | POA: Diagnosis not present

## 2023-07-07 DIAGNOSIS — R339 Retention of urine, unspecified: Secondary | ICD-10-CM | POA: Diagnosis not present

## 2023-07-07 DIAGNOSIS — I160 Hypertensive urgency: Secondary | ICD-10-CM | POA: Diagnosis not present

## 2023-07-07 DIAGNOSIS — E1165 Type 2 diabetes mellitus with hyperglycemia: Secondary | ICD-10-CM | POA: Diagnosis not present

## 2023-07-07 DIAGNOSIS — I639 Cerebral infarction, unspecified: Secondary | ICD-10-CM | POA: Diagnosis not present

## 2023-07-07 DIAGNOSIS — G8194 Hemiplegia, unspecified affecting left nondominant side: Secondary | ICD-10-CM | POA: Diagnosis not present

## 2023-07-07 DIAGNOSIS — D72829 Elevated white blood cell count, unspecified: Secondary | ICD-10-CM | POA: Diagnosis not present

## 2023-07-08 DIAGNOSIS — I160 Hypertensive urgency: Secondary | ICD-10-CM | POA: Diagnosis not present

## 2023-07-08 DIAGNOSIS — E1165 Type 2 diabetes mellitus with hyperglycemia: Secondary | ICD-10-CM | POA: Diagnosis not present

## 2023-07-08 DIAGNOSIS — D72829 Elevated white blood cell count, unspecified: Secondary | ICD-10-CM | POA: Diagnosis not present

## 2023-07-08 DIAGNOSIS — R339 Retention of urine, unspecified: Secondary | ICD-10-CM | POA: Diagnosis not present

## 2023-07-08 DIAGNOSIS — I639 Cerebral infarction, unspecified: Secondary | ICD-10-CM | POA: Diagnosis not present

## 2023-07-09 DIAGNOSIS — R29702 NIHSS score 2: Secondary | ICD-10-CM | POA: Diagnosis not present

## 2023-07-09 DIAGNOSIS — R202 Paresthesia of skin: Secondary | ICD-10-CM | POA: Diagnosis not present

## 2023-07-09 DIAGNOSIS — E1165 Type 2 diabetes mellitus with hyperglycemia: Secondary | ICD-10-CM | POA: Diagnosis not present

## 2023-07-09 DIAGNOSIS — I160 Hypertensive urgency: Secondary | ICD-10-CM | POA: Diagnosis not present

## 2023-07-09 DIAGNOSIS — D72829 Elevated white blood cell count, unspecified: Secondary | ICD-10-CM | POA: Diagnosis not present

## 2023-07-09 DIAGNOSIS — G8194 Hemiplegia, unspecified affecting left nondominant side: Secondary | ICD-10-CM | POA: Diagnosis not present

## 2023-07-09 DIAGNOSIS — I639 Cerebral infarction, unspecified: Secondary | ICD-10-CM | POA: Diagnosis not present

## 2023-07-09 DIAGNOSIS — I674 Hypertensive encephalopathy: Secondary | ICD-10-CM | POA: Diagnosis not present

## 2023-07-09 DIAGNOSIS — R339 Retention of urine, unspecified: Secondary | ICD-10-CM | POA: Diagnosis not present

## 2023-07-10 DIAGNOSIS — R202 Paresthesia of skin: Secondary | ICD-10-CM | POA: Diagnosis not present

## 2023-07-10 DIAGNOSIS — I639 Cerebral infarction, unspecified: Secondary | ICD-10-CM | POA: Diagnosis not present

## 2023-07-10 DIAGNOSIS — R339 Retention of urine, unspecified: Secondary | ICD-10-CM | POA: Diagnosis not present

## 2023-07-10 DIAGNOSIS — E1165 Type 2 diabetes mellitus with hyperglycemia: Secondary | ICD-10-CM | POA: Diagnosis not present

## 2023-07-10 DIAGNOSIS — I160 Hypertensive urgency: Secondary | ICD-10-CM | POA: Diagnosis not present

## 2023-07-10 DIAGNOSIS — D72829 Elevated white blood cell count, unspecified: Secondary | ICD-10-CM | POA: Diagnosis not present

## 2023-07-10 DIAGNOSIS — I674 Hypertensive encephalopathy: Secondary | ICD-10-CM | POA: Diagnosis not present

## 2023-07-11 DIAGNOSIS — R29702 NIHSS score 2: Secondary | ICD-10-CM | POA: Diagnosis not present

## 2023-07-11 DIAGNOSIS — I674 Hypertensive encephalopathy: Secondary | ICD-10-CM | POA: Diagnosis not present

## 2023-07-11 DIAGNOSIS — D72829 Elevated white blood cell count, unspecified: Secondary | ICD-10-CM | POA: Diagnosis not present

## 2023-07-11 DIAGNOSIS — I639 Cerebral infarction, unspecified: Secondary | ICD-10-CM | POA: Diagnosis not present

## 2023-07-11 DIAGNOSIS — R339 Retention of urine, unspecified: Secondary | ICD-10-CM | POA: Diagnosis not present

## 2023-07-11 DIAGNOSIS — R202 Paresthesia of skin: Secondary | ICD-10-CM | POA: Diagnosis not present

## 2023-07-11 DIAGNOSIS — E1165 Type 2 diabetes mellitus with hyperglycemia: Secondary | ICD-10-CM | POA: Diagnosis not present

## 2023-07-11 DIAGNOSIS — G8194 Hemiplegia, unspecified affecting left nondominant side: Secondary | ICD-10-CM | POA: Diagnosis not present

## 2023-07-11 DIAGNOSIS — I160 Hypertensive urgency: Secondary | ICD-10-CM | POA: Diagnosis not present

## 2023-07-12 DIAGNOSIS — D72829 Elevated white blood cell count, unspecified: Secondary | ICD-10-CM | POA: Diagnosis not present

## 2023-07-12 DIAGNOSIS — R339 Retention of urine, unspecified: Secondary | ICD-10-CM | POA: Diagnosis not present

## 2023-07-12 DIAGNOSIS — I160 Hypertensive urgency: Secondary | ICD-10-CM | POA: Diagnosis not present

## 2023-07-12 DIAGNOSIS — E1165 Type 2 diabetes mellitus with hyperglycemia: Secondary | ICD-10-CM | POA: Diagnosis not present

## 2023-07-12 DIAGNOSIS — I639 Cerebral infarction, unspecified: Secondary | ICD-10-CM | POA: Diagnosis not present
